# Patient Record
Sex: Female | Born: 1950 | Race: White | Hispanic: No | Marital: Married | Smoking: Never smoker
Health system: Southern US, Community
[De-identification: ages and names within clinical notes are randomized; demographics above are authoritative.]

## PROBLEM LIST (undated history)

## (undated) DIAGNOSIS — E782 Mixed hyperlipidemia: Secondary | ICD-10-CM

## (undated) HISTORY — DX: Mixed hyperlipidemia: E78.2

## (undated) HISTORY — PX: EYE SURGERY: SHX253

---

## 1990-07-12 HISTORY — PX: OTHER SURGICAL HISTORY: SHX169

## 2010-04-15 ENCOUNTER — Emergency Department: Payer: Self-pay | Admitting: Emergency Medicine

## 2011-03-23 ENCOUNTER — Emergency Department: Payer: Self-pay | Admitting: *Deleted

## 2011-05-08 IMAGING — CT CT ABD-PELV W/ CM
1 of 2 series · 15 of 32 positions shown, 19 images · IV contrast (isovue)
Comparison: None

REASON FOR EXAM: (1) abd pain - LLQ - suspect diverticulitis; (2) abd
pain - LLQ - suspect divert
COMMENTS:

PROCEDURE:     CT  - CT ABDOMEN / PELVIS  W  - April 15, 2010  [DATE]
RESULT:     History: Abdominal pain
TECHNIQUE: Multiple axial images of the abdomen and pelvis were performed
from the lung bases to the pubic symphysis, with p.o. contrast and with 85
mL of Isovue 370 intravenous contrast.

[Series 2: abdomen · axial · 0.56mm/px · z∈[-1106,-771]mm · 15 of 75 slices shown, 19 images]
[im 4/75  soft-tissue]
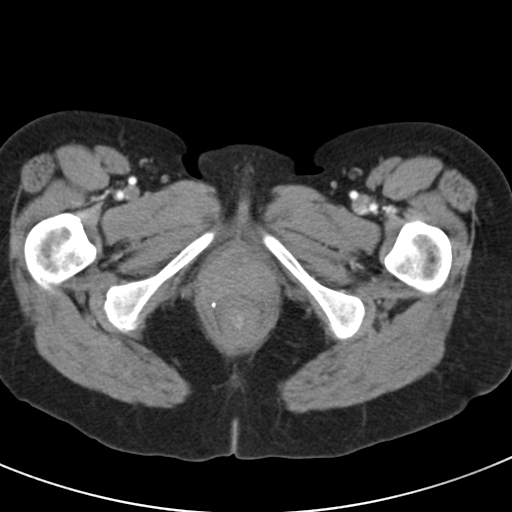
[im 4/75  bone]
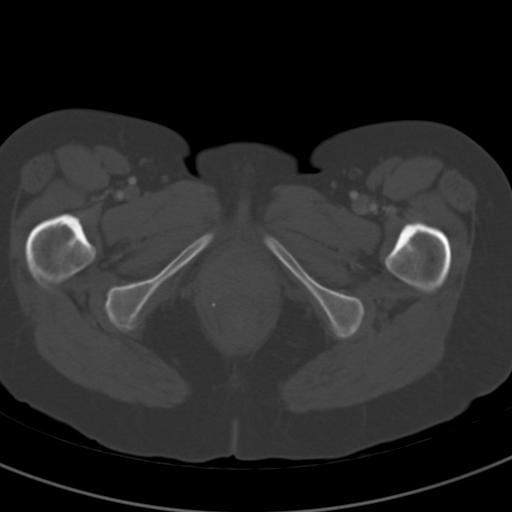
[im 10/75  soft-tissue]
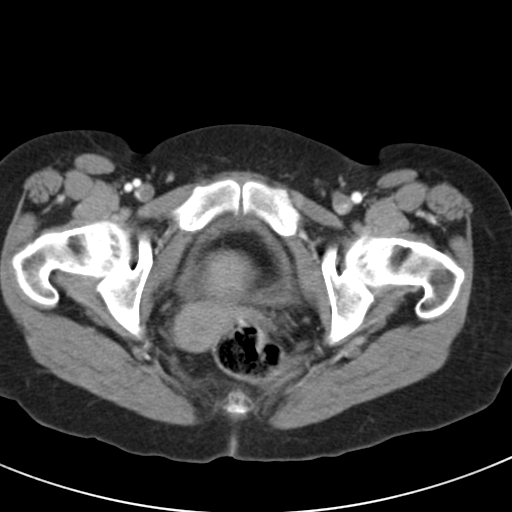
[im 16/75  soft-tissue]
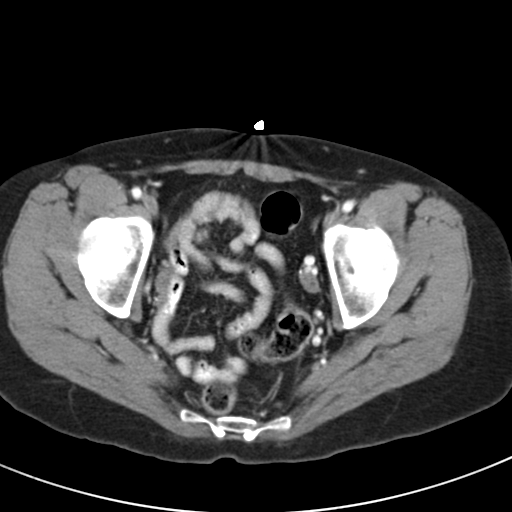
[im 22/75  soft-tissue]
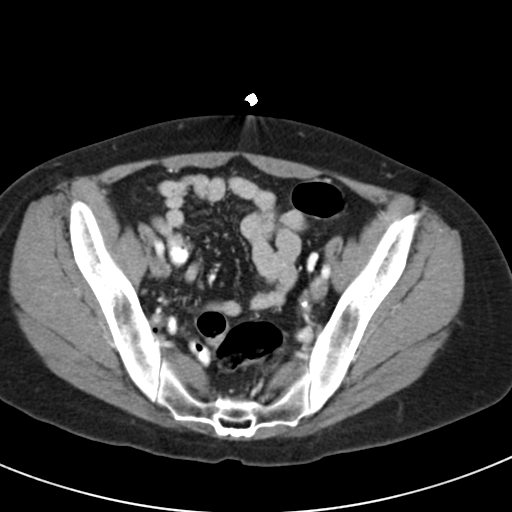
[im 25/75  soft-tissue]
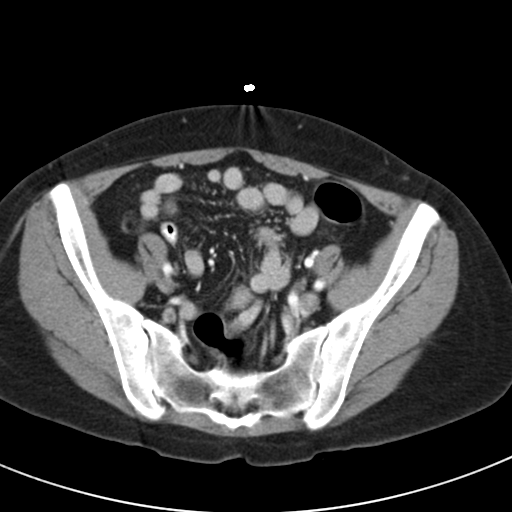
[im 31/75  soft-tissue]
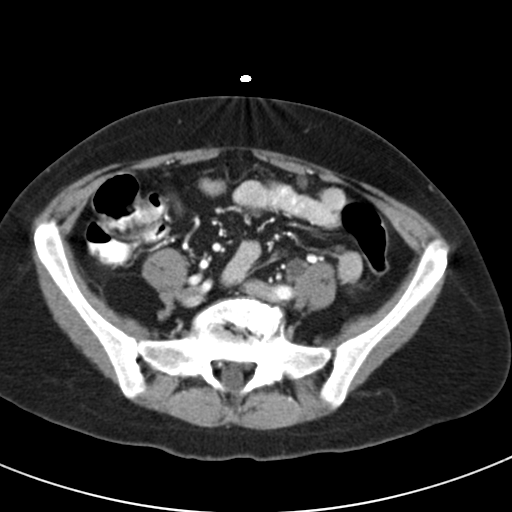
[im 38/75  soft-tissue]
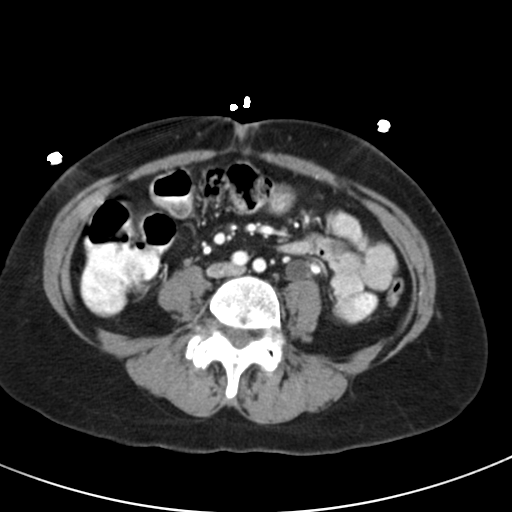
[im 44/75  soft-tissue]
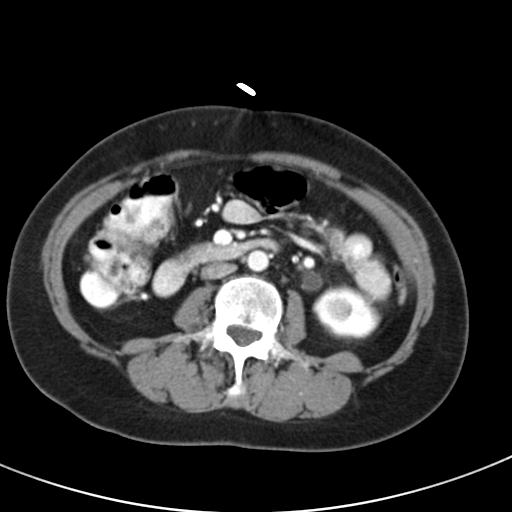
[im 50/75  soft-tissue]
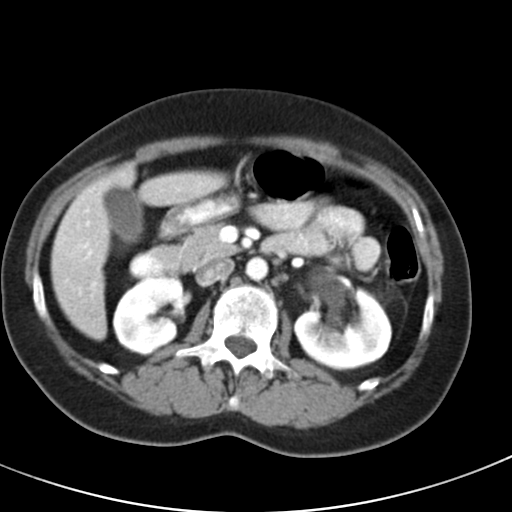
[im 50/75  bone]
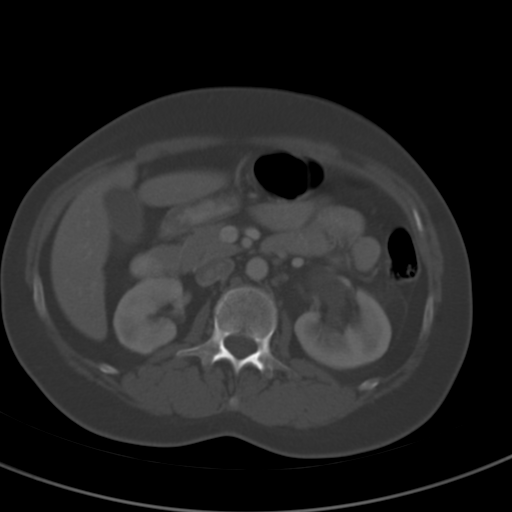
[im 53/75  soft-tissue]
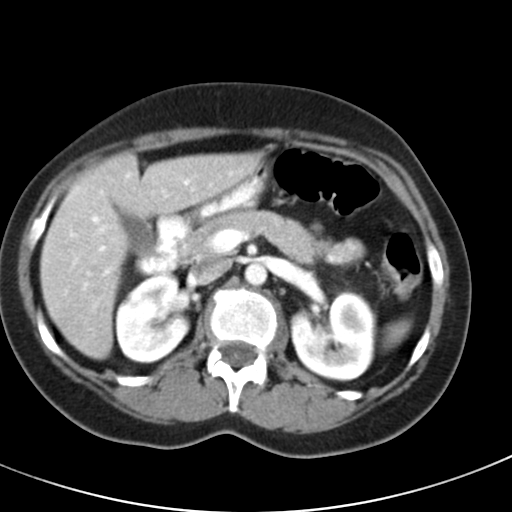
[im 59/75  soft-tissue]
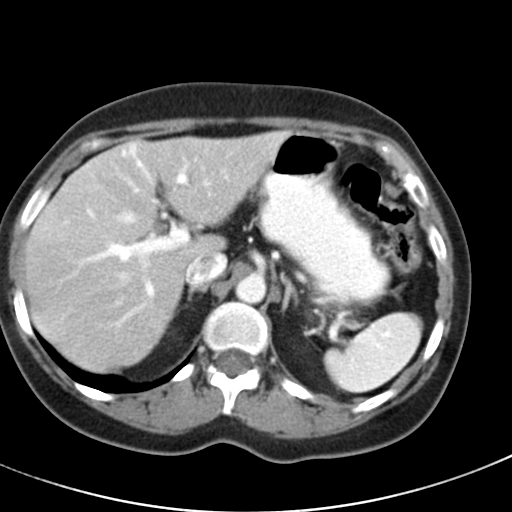
[im 62/75  lung]
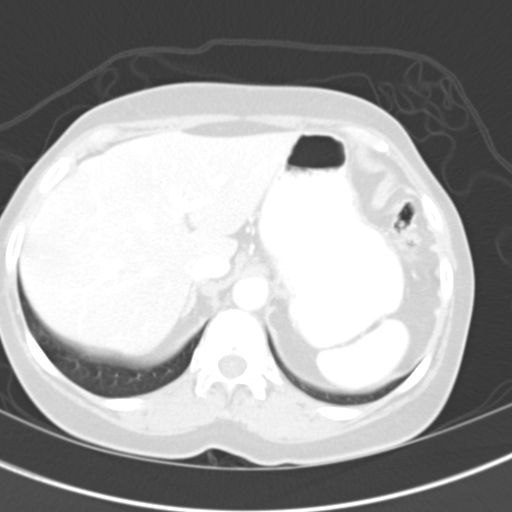
[im 65/75  soft-tissue]
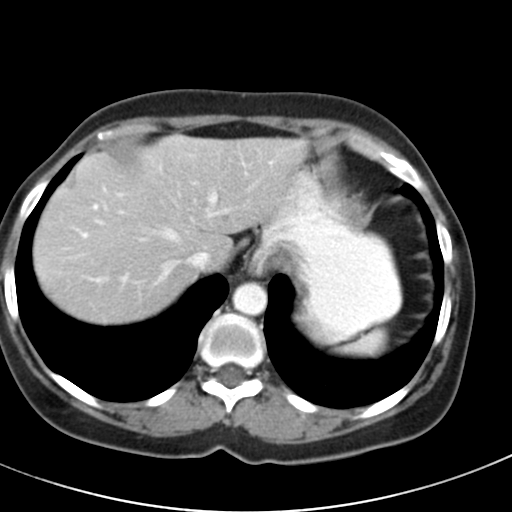
[im 65/75  lung]
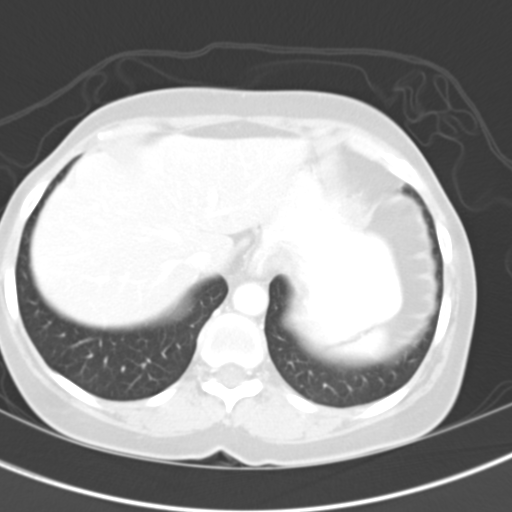
[im 68/75  lung]
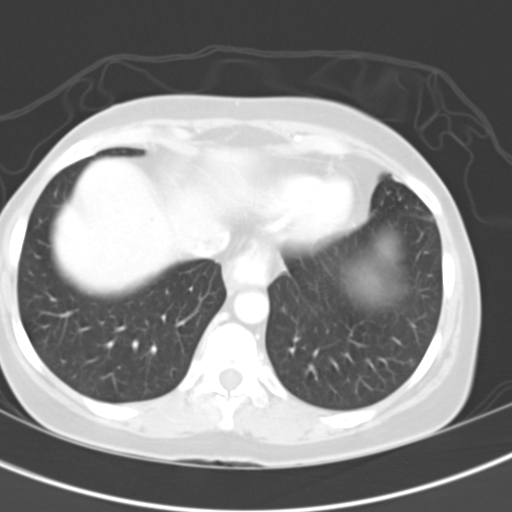
[im 71/75  soft-tissue]
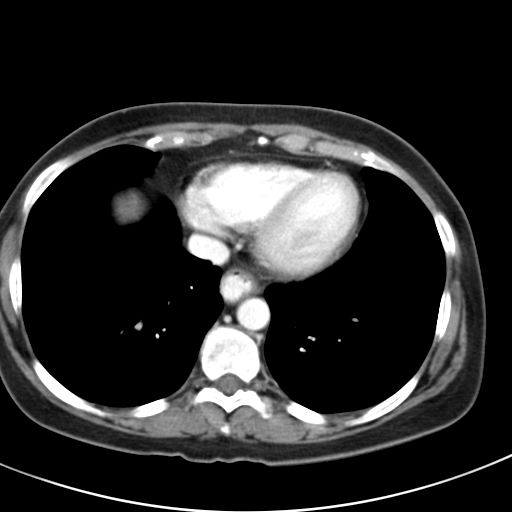
[im 71/75  lung]
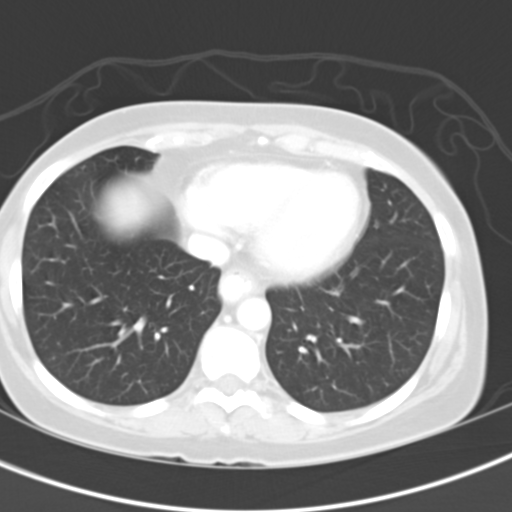

[15 of 32 positions shown; findings below may reference images not displayed]

FINDINGS: The lung bases are clear. There is no pneumothorax. The heart size is
normal.

The liver demonstrates no focal abnormality. There is no intrahepatic or
extrahepatic biliary ductal dilatation. The gallbladder is unremarkable. The
spleen demonstrates no focal abnormality. There is a 3 mm a distal left
ureteral calculus resulting in moderate left hydroureteronephrosis. There
multiple nonobstructing left nephrolithiasis. The right kidney, adrenal
glands, and pancreas are normal. The bladder is unremarkable.

The stomach, duodenum, small intestine, and large intestine demonstrate no
contrast extravasation or dilatation.  There is no pneumoperitoneum,
pneumatosis, or portal venous gas. There is no abdominal or pelvic free
fluid. There is no lymphadenopathy.

The abdominal aorta is normal in caliber .

There is lumbar spine spondylosis.
IMPRESSION: There is a 3 mm a distal left ureteral calculus resulting in moderate left
hydroureteronephrosis.

## 2012-11-28 ENCOUNTER — Ambulatory Visit: Payer: Self-pay | Admitting: Ophthalmology

## 2013-07-12 HISTORY — PX: EYE SURGERY: SHX253

## 2014-04-30 ENCOUNTER — Ambulatory Visit: Payer: Self-pay | Admitting: Ophthalmology

## 2014-11-01 NOTE — Op Note (Signed)
PATIENT NAME:  Cindy [name], [name] MR#:  [id number] DATE OF BIRTH:  [birth year]  DATE OF PROCEDURE:  11/28/2012  PREOPERATIVE DIAGNOSIS: Visually significant cataract of the right eye.   POSTOPERATIVE DIAGNOSIS: Visually significant cataract of the right eye.   OPERATIVE PROCEDURE: Cataract extraction by phacoemulsification with implant of intraocular lens to the right eye.   SURGEON: Galen ManilaWilliam Desten Manor, MD  ANESTHESIA:  1. Managed anesthesia care.  2. 50-50 mixture of 0.75% bupivacaine and 4% Xylocaine given as a retrobulbar block.   COMPLICATIONS: None.   TECHNIQUE: Stop and chop.  DESCRIPTION OF PROCEDURE: The patient was examined and consented for this procedure in the preoperative holding area and then brought back to the Operating Room where the anesthesia team employed managed anesthesia care.  3.5 milliliters of the aforementioned mixture were placed in the right orbit on an Atkinson needle without complication. The right eye was then prepped and draped in the usual sterile ophthalmic fashion. A lid speculum was placed. The side-port blade was used to create a paracentesis and the anterior chamber was filled with viscoelastic. The keratome was used to create a near clear corneal incision. The continuous curvilinear capsulorrhexis was performed with a cystotome followed by the capsulorrhexis forceps. Hydrodissection and hydrodelineation were carried out with BSS on a blunt cannula. The lens was removed in a stop and chop technique. The remaining cortical material was removed with the irrigation-aspiration handpiece. The capsular bag was inflated with viscoelastic and the Tecnis ZCB00 27.0-diopter lens, serial number [id number] was placed in the capsular bag without complication. The remaining viscoelastic was removed from the eye with the irrigation-aspiration handpiece. The wounds were hydrated. The anterior chamber was flushed with Miostat and the eye was inflated to a physiologic pressure. 0.1  mL of cefuroxime concentration 10 mg/mL was placed in the anterior chamber. The wounds were found to be water tight. The eye was dressed with Vigamox followed by Maxitrol ointment and a protective shield was placed. The patient will followup with me in one day.   Please note that VisionBlue dye was used to stain the anterior capsule prior to performing the capsulorrhexis, as this was a completely white cataract.   ____________________________ Jerilee FieldWilliam L. Mahayla Haddaway, MD wlp:jm D: 11/28/2012 13:17:39 ET T: 11/28/2012 14:05:16 ET JOB#: [id number]  cc: Kameren Baade L. Danae Oland, MD, <Dictator> Jerilee FieldWILLIAM L Tetsuo Coppola MD ELECTRONICALLY SIGNED 12/07/2012 17:55

## 2014-11-02 NOTE — Op Note (Signed)
PATIENT NAME:  Cindy [name], Cindy [name] MR#:  [id number] DATE OF BIRTH:  1951/05/07  DATE OF PROCEDURE:  04/30/2014  PREOPERATIVE DIAGNOSIS:  Visually significant cataract of the left eye.   POSTOPERATIVE DIAGNOSIS:  Visually significant cataract of the left eye.   OPERATIVE PROCEDURE:  Cataract extraction by phacoemulsification with implant of intraocular lens to left eye.   SURGEON:  Jerilee FieldWilliam L. Vernell Back, MD  ANESTHESIA:  1. Managed anesthesia care.  2. Topical tetracaine drops followed by 2% Xylocaine jelly applied in the preoperative holding area.   COMPLICATIONS:  None.   TECHNIQUE:  Stop and chop.   DESCRIPTION OF PROCEDURE:  The patient was examined and consented in the preoperative holding area where the aforementioned topical anesthesia was applied to the left eye and then brought back to the Operating Room where the left eye was prepped and draped in the usual sterile ophthalmic fashion and [name] lid speculum was placed. [name] paracentesis was created with the side port blade and the anterior chamber was filled with viscoelastic. [name] near clear corneal incision was performed with the steel keratome. [name] continuous curvilinear capsulorrhexis was performed with [name] cystotome followed by the capsulorrhexis forceps. Hydrodissection and hydrodelineation were carried out with BSS on [name] blunt cannula. The lens was removed in [name] stop and chop technique and the remaining cortical material was removed with the irrigation-aspiration handpiece. The capsular bag was inflated with viscoelastic and the Tecnis ZCB00, 26.0-diopter lens, serial number [id number], was placed in the capsular bag without complication. The remaining viscoelastic was removed from the eye with the irrigation-aspiration handpiece. The wounds were hydrated. The anterior chamber was flushed with Miostat and the eye was inflated to physiologic pressure. 0.1 mL of cefuroxime concentration 10 mg/mL was placed in the anterior chamber. The wounds were found to  be water tight. The eye was dressed with Vigamox. The patient was given protective glasses to wear throughout the day and [name] shield with which to sleep tonight. The patient was also given drops with which to begin [name] drop regimen today and will follow up with me in one day.    ____________________________ Jerilee FieldWilliam L. Anberlin Diez, MD wlp:nb D: 04/30/2014 21:27:53 ET T: 05/01/2014 00:30:28 ET JOB#: [id number]  cc: Shai Mckenzie L. Channah Godeaux, MD, <Dictator> Jerilee FieldWILLIAM L Kamariah Fruchter MD ELECTRONICALLY SIGNED 05/02/2014 8:55

## 2016-08-27 ENCOUNTER — Emergency Department
Admission: EM | Admit: 2016-08-27 | Discharge: 2016-08-28 | Disposition: A | Discharge status: Home / Self Care | Payer: BLUE CROSS/BLUE SHIELD | Attending: Emergency Medicine | Admitting: Emergency Medicine

## 2016-08-27 DIAGNOSIS — B349 Viral infection, unspecified: Secondary | ICD-10-CM | POA: Diagnosis not present

## 2016-08-27 DIAGNOSIS — E869 Volume depletion, unspecified: Secondary | ICD-10-CM | POA: Insufficient documentation

## 2016-08-27 DIAGNOSIS — R55 Syncope and collapse: Secondary | ICD-10-CM

## 2016-08-27 LAB — CBC WITH DIFFERENTIAL/PLATELET
BASOS PCT: 1 %
Basophils Absolute: 0 10*3/uL (ref 0–0.1)
Eosinophils Absolute: 0 10*3/uL (ref 0–0.7)
Eosinophils Relative: 0 %
HEMATOCRIT: 38.6 % (ref 35.0–47.0)
HEMOGLOBIN: 12.9 g/dL (ref 12.0–16.0)
LYMPHS PCT: 12 %
Lymphs Abs: 0.9 10*3/uL — ABNORMAL LOW (ref 1.0–3.6)
MCH: 31.6 pg (ref 26.0–34.0)
MCHC: 33.5 g/dL (ref 32.0–36.0)
MCV: 94.3 fL (ref 80.0–100.0)
MONOS PCT: 6 %
Monocytes Absolute: 0.5 10*3/uL (ref 0.2–0.9)
NEUTROS ABS: 6 10*3/uL (ref 1.4–6.5)
NEUTROS PCT: 81 %
Platelets: 207 10*3/uL (ref 150–440)
RBC: 4.09 MIL/uL (ref 3.80–5.20)
RDW: 15.1 % — ABNORMAL HIGH (ref 11.5–14.5)
WBC: 7.4 10*3/uL (ref 3.6–11.0)

## 2016-08-27 LAB — BASIC METABOLIC PANEL
ANION GAP: 9 (ref 5–15)
BUN: 22 mg/dL — ABNORMAL HIGH (ref 6–20)
CALCIUM: 8.6 mg/dL — AB (ref 8.9–10.3)
CHLORIDE: 101 mmol/L (ref 101–111)
CO2: 24 mmol/L (ref 22–32)
Creatinine, Ser: 0.85 mg/dL (ref 0.44–1.00)
GFR calc Af Amer: >60 mL/min (ref >60)
GFR calc non Af Amer: >60 mL/min (ref >60)
Glucose, Bld: 91 mg/dL (ref 65–99)
POTASSIUM: 3.8 mmol/L (ref 3.5–5.1)
Sodium: 134 mmol/L — ABNORMAL LOW (ref 135–145)

## 2016-08-27 LAB — HEPATIC FUNCTION PANEL
ALBUMIN: 4.3 g/dL (ref 3.5–5.0)
ALK PHOS: 60 U/L (ref 38–126)
ALT: 20 U/L (ref 14–54)
AST: 30 U/L (ref 15–41)
BILIRUBIN INDIRECT: 1 mg/dL — AB (ref 0.3–0.9)
Bilirubin, Direct: 0.1 mg/dL (ref 0.1–0.5)
Total Bilirubin: 1.1 mg/dL (ref 0.3–1.2)
Total Protein: 7.9 g/dL (ref 6.5–8.1)

## 2016-08-27 LAB — TROPONIN I: Troponin I: <0.03 ng/mL (ref <0.03)

## 2016-08-27 LAB — MAGNESIUM: Magnesium: 1.9 mg/dL (ref 1.7–2.4)

## 2016-08-27 MED ORDER — SODIUM CHLORIDE 0.9 % IV BOLUS (SEPSIS)
1000.0000 mL | Freq: Once | INTRAVENOUS | Status: AC
  Administered 2016-08-27: 1000 mL via INTRAVENOUS

## 2016-08-27 NOTE — ED Triage Notes (Signed)
Patient c/o nausea, weakness, dizziness/lightheadedness beginning Wednesday. Pt had one unwitnessed syncopal episode today.

## 2016-08-27 NOTE — ED Provider Notes (Signed)
[facility] Emergency Department Provider Note  ____________________________________________   First MD Initiated Contact with Patient 08/27/16 2259     (approximate)  I have reviewed the triage vital signs and the nursing notes.   HISTORY  Chief Complaint Loss of Consciousness and Nausea    HPI Cindy [name] is a 66 y.o. female with no personal reported medical history and who is generally healthy with no prior episodes of syncope or heart disease who presents for evaluation of general malaise over the last several days, nausea, diarrhea, and then a syncopal episode a couple of hours prior to arrival.  She states that she has been feeling generalized weakness over the last few days with lightheadedness and dizzy feeling which has been gradually worsening.  She has not had an appetite and has not been eating anything because she has been so nauseated.  She also reports multiple episodes of diarrhea each day for the last several days.  She has not had any abdominal pain and also denies shortness of breath and chest pain.  She states that the syncopal episode occurred relatively acutely but she did feel herself gradually getting hot all over, then had a cold sweat, became lightheaded, and then passed out.  Her son heard her hit the floor but she does not report any headache or neck pain at this time and did not sustain any other injuries.  She had no seizure-like activity and did not have any postictal symptoms.  She is alert and oriented at this time and dates that she feels fine currently.   History reviewed. No pertinent past medical history.  There are no active problems to display for this patient.   Past Surgical History:  Procedure Laterality Date  . EYE SURGERY     cataracts    Prior to Admission medications   Not on File    Allergies Patient has no known allergies.  Family History  Problem Relation Age of Onset  . Heart failure Mother      Social History Social History  Substance Use Topics  . Smoking status: Never Smoker  . Smokeless tobacco: Never Used  . Alcohol use No    Review of Systems Constitutional: No fever/chills. General malaise. Eyes: No visual changes. ENT: No sore throat. Cardiovascular: Denies chest pain.  +Syncope and collapse Respiratory: Denies shortness of breath. Gastrointestinal: No abdominal pain.  Nausea for several days, no vomiting.  Diarrhea several times a day for 2-3 days.  Decreased PO intake and appetite. Genitourinary: Negative for dysuria. Musculoskeletal: Negative for back pain. Skin: Negative for rash. Neurological: Negative for headaches, focal weakness or numbness.  10-point ROS otherwise negative.  ____________________________________________   PHYSICAL EXAM:  VITAL SIGNS: ED Triage Vitals  Enc Vitals Group     BP 08/27/16 2215 (!) 107/58     Pulse Rate 08/27/16 2215 (!) 52     Resp 08/27/16 2215 15     Temp 08/27/16 2215 97.6 F (36.4 C)     Temp Source 08/27/16 2215 Oral     SpO2 08/27/16 2215 100 %     Weight 08/27/16 2216 110 lb (49.9 kg)     Height 08/27/16 2216 5' (1.524 m)     Head Circumference --      Peak Flow --      Pain Score --      Pain Loc --      Pain Edu? --      Excl. in GC? --  Constitutional: Alert and oriented. Well appearing and in no acute distress. Eyes: Conjunctivae are normal. PERRL. EOMI. Head: Atraumatic. Nose: No congestion/rhinnorhea. Mouth/Throat: Mucous membranes are moist. Neck: No stridor.  No meningeal signs.   Cardiovascular: Normal rate, regular rhythm. Good peripheral circulation. Grossly normal heart sounds. Respiratory: Normal respiratory effort.  No retractions. Lungs CTAB. Gastrointestinal: Soft and nontender. No distention.  Musculoskeletal: No lower extremity tenderness nor edema. No gross deformities of extremities. Neurologic:  Normal speech and language. No gross focal neurologic deficits are  appreciated.  Skin:  Skin is warm, dry and intact. No rash noted. Psychiatric: Mood and affect are normal. Speech and behavior are normal.  ____________________________________________   LABS (all labs ordered are listed, but only abnormal results are displayed)  Labs Reviewed  BASIC METABOLIC PANEL - Abnormal; Notable for the following:       Result Value   Sodium 134 (*)    BUN 22 (*)    Calcium 8.6 (*)    All other components within normal limits  URINALYSIS, COMPLETE (UACMP) WITH MICROSCOPIC - Abnormal; Notable for the following:    Color, Urine YELLOW (*)    APPearance CLEAR (*)    Ketones, ur 20 (*)    Squamous Epithelial / LPF 0-5 (*)    All other components within normal limits  CBC WITH DIFFERENTIAL/PLATELET - Abnormal; Notable for the following:    RDW 15.1 (*)    Lymphs Abs 0.9 (*)    All other components within normal limits  HEPATIC FUNCTION PANEL - Abnormal; Notable for the following:    Indirect Bilirubin 1.0 (*)    All other components within normal limits  TROPONIN I  MAGNESIUM  CBG MONITORING, ED   ____________________________________________  EKG  ED ECG REPORT I, Zaviyar Rahal, the attending physician, personally viewed and interpreted this ECG.  Date: 08/27/2016 EKG Time: 22:30 Rate: 50 Rhythm: sinus bradycardia QRS Axis: normal Intervals: normal ST/T Wave abnormalities: normal Conduction Disturbances: none Narrative Interpretation: unremarkable  ____________________________________________  RADIOLOGY   No results found.  ____________________________________________   PROCEDURES  Procedure(s) performed:   Procedures   Critical Care performed: No ____________________________________________   INITIAL IMPRESSION / ASSESSMENT AND PLAN / ED COURSE  Pertinent labs & imaging results that were available during my care of the patient were reviewed by me and considered in my medical decision making (see chart for details).  Low  risk syncope based on the [place] syncope rule.  This is also in the setting of about 2-3 days of GI symptoms including nausea, decreased by mouth intake, and numerous episodes of diarrhea according to the patient.  Her CBC is normal with no anemia and no leukocytosis.  Metabolic panel is pending and I added on troponin.  Her EKG is reassuring.  She has had no other symptoms except those as described above and specifically no chest pain, no shortness of breath.  She is neurologically intact.  No indication for head CT or MRI at this point.  She is getting 1 L of fluids and then I will reassess after fluid bolus and checking electrolytes.  Patient and son agree with plan.   Clinical Course as of Aug 29 127  Sat Aug 28, 2016  0033 The patient has completed her 1 L fluid bolus and states that she feels better.  She has been sleeping comfortably.  Her labs were all unremarkable with no sign of acute infection and no electrolyte abnormalities.  I discussed the plan to check orthostatic vitals  and then discharge her for outpatient follow-up if she feels okay.  She and her son are in agreement with the plan.  [CF]  0125 Orthostatics reassuring.  Patient feels better and will follow up as outpatient.  [CF]    Clinical Course User Index [CF] Loleta Rose, MD    ____________________________________________  FINAL CLINICAL IMPRESSION(S) / ED DIAGNOSES  Final diagnoses:  Syncope and collapse  Viral syndrome  Volume depletion     MEDICATIONS GIVEN DURING THIS VISIT:  Medications  sodium chloride 0.9 % bolus 1,000 mL (1,000 mLs Intravenous New Bag/Given 08/27/16 2258)     NEW OUTPATIENT MEDICATIONS STARTED DURING THIS VISIT:  New Prescriptions   No medications on file    Modified Medications   No medications on file    Discontinued Medications   No medications on file     Note:  This document was prepared using Dragon voice recognition software and may include unintentional  dictation errors.    Loleta Rose, MD 08/28/16 [phone]

## 2016-08-28 LAB — URINALYSIS, COMPLETE (UACMP) WITH MICROSCOPIC
BACTERIA UA: NONE SEEN
BILIRUBIN URINE: NEGATIVE
GLUCOSE, UA: NEGATIVE mg/dL
Hgb urine dipstick: NEGATIVE
KETONES UR: 20 mg/dL — AB
Leukocytes, UA: NEGATIVE
NITRITE: NEGATIVE
PROTEIN: NEGATIVE mg/dL
Specific Gravity, Urine: 1.016 (ref 1.005–1.030)
pH: 6 (ref 5.0–8.0)

## 2016-08-28 MED ORDER — ONDANSETRON 4 MG PO TBDP: ORAL_TABLET | ORAL | 0 refills | Status: DC

## 2016-08-28 NOTE — Discharge Instructions (Signed)

## 2020-10-31 ENCOUNTER — Encounter: Payer: Self-pay | Admitting: Podiatry

## 2020-10-31 ENCOUNTER — Other Ambulatory Visit: Payer: Self-pay

## 2020-10-31 ENCOUNTER — Ambulatory Visit (INDEPENDENT_AMBULATORY_CARE_PROVIDER_SITE_OTHER): Payer: BC Managed Care – PPO

## 2020-10-31 ENCOUNTER — Ambulatory Visit: Payer: BC Managed Care – PPO | Admitting: Podiatry

## 2020-10-31 VITALS — BP 127/84 | HR 67 | Temp 98.2°F | Resp 16

## 2020-10-31 DIAGNOSIS — M778 Other enthesopathies, not elsewhere classified: Secondary | ICD-10-CM

## 2020-10-31 MED ORDER — MELOXICAM 15 MG PO TABS: 15.0000 mg | ORAL_TABLET | Freq: Every day | ORAL | 1 refills | Status: DC

## 2020-11-16 MED ORDER — BETAMETHASONE SOD PHOS & ACET 6 (3-3) MG/ML IJ SUSP: 3.0000 mg | Freq: Once | INTRAMUSCULAR | Status: DC

## 2020-11-16 NOTE — Progress Notes (Signed)
   HPI: 70 y.o. female presenting today as a new patient for evaluation of right foot pain this been going on for approximately 2 months.  Gradual onset.  Patient states is very tender.  Aggravated by walking and tight shoes.  She has been applying pain cream and taking Tylenol.  She denies a history of injury.  No past medical history on file.   Physical Exam: General: The patient is alert and oriented x3 in no acute distress.  Dermatology: Skin is warm, dry and supple bilateral lower extremities. Negative for open lesions or macerations.  Vascular: Palpable pedal pulses bilaterally. No edema or erythema noted. Capillary refill within normal limits.  Neurological: Epicritic and protective threshold grossly intact bilaterally.   Musculoskeletal Exam: Range of motion within normal limits to all pedal and ankle joints bilateral. Muscle strength 5/5 in all groups bilateral.  There is pain on palpation throughout the right midtarsal joint of the right foot Radiographic Exam:  Normal osseous mineralization. Joint spaces preserved. No fracture/dislocation/boney destruction.    Assessment: 1.  Capsulitis right midtarsal joint   Plan of Care:  1. Patient evaluated. X-Rays reviewed.  2.  Injection of 0.5 cc Celestone Soluspan injected into the right midtarsal joint/midfoot 3.  Prescription for meloxicam 15 mg daily 4.  Recommend good supportive shoes and sneakers.  Patient's current sneakers did not allow for any support 5.  Return to clinic in 4 weeks  *Works at [employer], DPM [facility]  Dr. Felecia Shelling, DPM    [address] [place], [place] [address]                Office [phone]  Fax [phone]

## 2020-12-05 ENCOUNTER — Other Ambulatory Visit: Payer: Self-pay

## 2020-12-05 ENCOUNTER — Encounter: Payer: Self-pay | Admitting: Podiatry

## 2020-12-05 ENCOUNTER — Ambulatory Visit: Payer: BC Managed Care – PPO | Admitting: Podiatry

## 2020-12-05 DIAGNOSIS — M778 Other enthesopathies, not elsewhere classified: Secondary | ICD-10-CM | POA: Diagnosis not present

## 2020-12-05 MED ORDER — MELOXICAM 15 MG PO TABS: 15.0000 mg | ORAL_TABLET | Freq: Every day | ORAL | 1 refills | Status: DC

## 2020-12-05 NOTE — Progress Notes (Signed)
   HPI: 70 y.o. female presenting today for follow-up evaluation of capsulitis to the right foot.  Patient states that she has no pain.  She stated that the injection helped significantly as well as the meloxicam.  She also purchased a pair of new balance shoes for good support which helped also alleviate her pain and symptoms.  No new complaints at this time  No past medical history on file.   Physical Exam: General: The patient is alert and oriented x3 in no acute distress.  Dermatology: Skin is warm, dry and supple bilateral lower extremities. Negative for open lesions or macerations.  Vascular: Palpable pedal pulses bilaterally. No edema or erythema noted. Capillary refill within normal limits.  Neurological: Epicritic and protective threshold grossly intact bilaterally.   Musculoskeletal Exam: Range of motion within normal limits to all pedal and ankle joints bilateral. Muscle strength 5/5 in all groups bilateral.  Negative for any significant pain on palpation throughout the right midtarsal joint of the right foot  Assessment: 1.  Capsulitis right midtarsal joint; resolved   Plan of Care:  1. Patient evaluated.  2.  Continue meloxicam as needed.  Refill provided 3.  Continue wearing good supportive new balance shoes.  Patient states that they help a lot 4.  Return to clinic as needed  *Works at [employer], DPM [facility]  Dr. Felecia Shelling, DPM    [address] [place], [place] [address]                Office [phone]  Fax [phone]

## 2021-03-30 ENCOUNTER — Other Ambulatory Visit: Payer: Self-pay | Admitting: Podiatry

## 2021-03-30 ENCOUNTER — Telehealth: Payer: Self-pay | Admitting: Podiatry

## 2021-03-30 MED ORDER — MELOXICAM 15 MG PO TABS: 15.0000 mg | ORAL_TABLET | Freq: Every day | ORAL | 1 refills | Status: DC

## 2021-03-30 NOTE — Telephone Encounter (Signed)
Refill rx meloxicam sent to [facility] in [place]. No need for appt. - Dr. Logan Bores

## 2021-03-30 NOTE — Telephone Encounter (Signed)
Patient called stating she saw Dr Logan Bores months ago for an issue with her foot and he put her on medication. Patient states she is having the same issue. Patient wanted to know if she could get a RX called in for the medicine or does she need to come in? [facility] [place].

## 2021-03-31 NOTE — Telephone Encounter (Signed)
Called pt LM on VM RX was called in to pharmacy.

## 2022-11-11 ENCOUNTER — Ambulatory Visit: Payer: BC Managed Care – PPO | Admitting: Internal Medicine

## 2022-11-11 VITALS — BP 124/78 | HR 85 | Ht <= 58 in | Wt 98.8 lb

## 2022-11-11 DIAGNOSIS — Z1211 Encounter for screening for malignant neoplasm of colon: Secondary | ICD-10-CM

## 2022-11-11 DIAGNOSIS — Z8249 Family history of ischemic heart disease and other diseases of the circulatory system: Secondary | ICD-10-CM

## 2022-11-11 DIAGNOSIS — Z1389 Encounter for screening for other disorder: Secondary | ICD-10-CM

## 2022-11-11 DIAGNOSIS — Z1231 Encounter for screening mammogram for malignant neoplasm of breast: Secondary | ICD-10-CM | POA: Diagnosis not present

## 2022-11-11 DIAGNOSIS — Z0000 Encounter for general adult medical examination without abnormal findings: Secondary | ICD-10-CM

## 2022-11-11 DIAGNOSIS — R8281 Pyuria: Secondary | ICD-10-CM

## 2022-11-11 DIAGNOSIS — Z13820 Encounter for screening for osteoporosis: Secondary | ICD-10-CM

## 2022-11-11 LAB — POCT URINALYSIS DIPSTICK
Bilirubin, UA: NEGATIVE
Blood, UA: NEGATIVE
Glucose, UA: NEGATIVE
Ketones, UA: NEGATIVE
Nitrite, UA: NEGATIVE
Protein, UA: POSITIVE — AB
Spec Grav, UA: 1.025 (ref 1.010–1.025)
Urobilinogen, UA: 1 E.U./dL
pH, UA: 6.5 (ref 5.0–8.0)

## 2022-11-12 ENCOUNTER — Encounter: Payer: Self-pay | Admitting: Internal Medicine

## 2022-11-12 DIAGNOSIS — Z8249 Family history of ischemic heart disease and other diseases of the circulatory system: Secondary | ICD-10-CM | POA: Insufficient documentation

## 2022-11-12 DIAGNOSIS — R8281 Pyuria: Secondary | ICD-10-CM | POA: Insufficient documentation

## 2022-11-12 DIAGNOSIS — Z0000 Encounter for general adult medical examination without abnormal findings: Secondary | ICD-10-CM | POA: Insufficient documentation

## 2022-11-12 LAB — CBC WITH DIFFERENTIAL
Basophils Absolute: 0.1 10*3/uL (ref 0.0–0.2)
Basos: 2 %
EOS (ABSOLUTE): 0.3 10*3/uL (ref 0.0–0.4)
Eos: 4 %
Hematocrit: 40.2 % (ref 34.0–46.6)
Hemoglobin: 12.8 g/dL (ref 11.1–15.9)
Immature Grans (Abs): 0 10*3/uL (ref 0.0–0.1)
Immature Granulocytes: 0 %
Lymphocytes Absolute: 3.4 10*3/uL — ABNORMAL HIGH (ref 0.7–3.1)
Lymphs: 38 %
MCH: 30.6 pg (ref 26.6–33.0)
MCHC: 31.8 g/dL (ref 31.5–35.7)
MCV: 96 fL (ref 79–97)
Monocytes Absolute: 0.5 10*3/uL (ref 0.1–0.9)
Monocytes: 5 %
Neutrophils Absolute: 4.6 10*3/uL (ref 1.4–7.0)
Neutrophils: 51 %
RBC: 4.18 x10E6/uL (ref 3.77–5.28)
RDW: 13.1 % (ref 11.7–15.4)
WBC: 8.9 10*3/uL (ref 3.4–10.8)

## 2022-11-12 LAB — CMP14+EGFR
ALT: 10 IU/L (ref 0–32)
AST: 24 IU/L (ref 0–40)
Albumin/Globulin Ratio: 1.6 (ref 1.2–2.2)
Albumin: 4.4 g/dL (ref 3.9–4.9)
Alkaline Phosphatase: 85 IU/L (ref 44–121)
BUN/Creatinine Ratio: 20 (ref 12–28)
BUN: 18 mg/dL (ref 8–27)
Bilirubin Total: 0.5 mg/dL (ref 0.0–1.2)
CO2: 22 mmol/L (ref 20–29)
Calcium: 9.2 mg/dL (ref 8.7–10.3)
Chloride: 102 mmol/L (ref 96–106)
Creatinine, Ser: 0.89 mg/dL (ref 0.57–1.00)
Globulin, Total: 2.8 g/dL (ref 1.5–4.5)
Glucose: 80 mg/dL (ref 70–99)
Potassium: 4.2 mmol/L (ref 3.5–5.2)
Sodium: 139 mmol/L (ref 134–144)
Total Protein: 7.2 g/dL (ref 6.0–8.5)
eGFR: 70 mL/min/{1.73_m2} (ref >59)

## 2022-11-12 LAB — URINALYSIS
Bilirubin, UA: NEGATIVE
Glucose, UA: NEGATIVE
Ketones, UA: NEGATIVE
Nitrite, UA: NEGATIVE
RBC, UA: NEGATIVE
Specific Gravity, UA: 1.022 (ref 1.005–1.030)
Urobilinogen, Ur: 1 mg/dL (ref 0.2–1.0)
pH, UA: 6.5 (ref 5.0–7.5)

## 2022-11-12 LAB — LIPID PANEL W/O CHOL/HDL RATIO
Cholesterol, Total: 301 mg/dL — ABNORMAL HIGH (ref 100–199)
HDL: 75 mg/dL (ref >39)
LDL Chol Calc (NIH): 212 mg/dL — ABNORMAL HIGH (ref 0–99)
Triglycerides: 85 mg/dL (ref 0–149)
VLDL Cholesterol Cal: 14 mg/dL (ref 5–40)

## 2022-11-12 LAB — TSH: TSH: 4.34 u[IU]/mL (ref 0.450–4.500)

## 2022-11-12 NOTE — Progress Notes (Signed)
New Patient Office Visit  Subjective    Patient ID: Cindy [name], female    DOB: 1951/02/16  Age: 72 y.o. MRN: [id number]  CC:  Chief Complaint  Patient presents with   Establish Care    New Patient    HPI Cindy [name] presents to establish care Previous Primary Care provider/office:   she does not have additional concerns to discuss today.   Patient comes in to establish PMD.  Reports that she has not been to her doctor other than a podiatrist for her foot pain.  She has not had any preventative management.  Patient offers no complaints whatsoever.  She says she is still working and and works the night shift.  She sleeps during the day and thinks she gets adequate sleep.  She does not have any fatigue or weakness. Patient denies any chest pain or shortness of breath, no nausea vomiting, no diarrhea or constipation, and no aches or pains.  Patient has not had any mammogram, bone density ,or colon cancer screening.  Her last Pap was in 1992. Her PHQ-9/GAD score is 2/0.    Outpatient Encounter Medications as of 11/11/2022  Medication Sig   Acetaminophen (TYLENOL 8 HOUR PO) Take 200 mg by mouth as needed.   meloxicam (MOBIC) 15 MG tablet Take 1 tablet (15 mg total) by mouth daily. (Patient not taking: Reported on 11/11/2022)   ondansetron (ZOFRAN ODT) 4 MG disintegrating tablet Allow 1-2 tablets to dissolve in your mouth every 8 hours as needed for nausea/vomiting (Patient not taking: Reported on 10/31/2020)   Facility-Administered Encounter Medications as of 11/11/2022  Medication   betamethasone acetate-betamethasone sodium phosphate (CELESTONE) injection 3 mg    History reviewed. No pertinent past medical history.  Past Surgical History:  Procedure Laterality Date   EYE SURGERY     cataracts    Family History  Problem Relation Age of Onset   Heart failure Mother     Social History   Socioeconomic History   Marital status: Married    Spouse name: Not on file    Number of children: Not on file   Years of education: Not on file   Highest education level: Not on file  Occupational History   Not on file  Tobacco Use   Smoking status: Never   Smokeless tobacco: Never  Substance and Sexual Activity   Alcohol use: No   Drug use: No   Sexual activity: Not on file  Other Topics Concern   Not on file  Social History Narrative   Not on file   Social Determinants of Health   Financial Resource Strain: Not on file  Food Insecurity: Not on file  Transportation Needs: Not on file  Physical Activity: Not on file  Stress: Not on file  Social Connections: Not on file  Intimate Partner Violence: Not on file    Review of Systems  Constitutional:  Negative for chills, diaphoresis, fever, malaise/fatigue and weight loss.  HENT:  Negative for ear discharge, ear pain, hearing loss, sinus pain, sore throat and tinnitus.   Eyes:  Negative for blurred vision, double vision and photophobia.  Respiratory:  Negative for cough, shortness of breath and wheezing.   Cardiovascular:  Negative for chest pain, palpitations and claudication.  Gastrointestinal:  Negative for abdominal pain, blood in stool, constipation, diarrhea, heartburn, melena, nausea and vomiting.  Genitourinary:  Negative for dysuria, flank pain, frequency, hematuria and urgency.  Musculoskeletal:  Negative for back pain, falls, joint pain and  myalgias.  Skin: Negative.   Neurological:  Negative for dizziness, tingling, tremors, focal weakness, seizures, loss of consciousness, weakness and headaches.  Psychiatric/Behavioral:  Negative for depression, memory loss, substance abuse and suicidal ideas. The patient does not have insomnia.         Objective    BP 124/78   Pulse 85   Ht 4\' 10"  (1.473 m)   Wt 98 lb 12.8 oz (44.8 kg)   SpO2 99%   BMI 20.65 kg/m   Physical Exam Vitals and nursing note reviewed.  Constitutional:      Appearance: Normal appearance.  Cardiovascular:     Rate  and Rhythm: Normal rate and regular rhythm.     Pulses: Normal pulses.     Heart sounds: Normal heart sounds. No murmur heard. Pulmonary:     Breath sounds: No wheezing, rhonchi or rales.  Abdominal:     General: Abdomen is flat. Bowel sounds are normal. There is no distension.     Palpations: Abdomen is soft. There is no mass.     Tenderness: There is no abdominal tenderness. There is no guarding.     Hernia: No hernia is present.  Musculoskeletal:        General: No swelling or tenderness. Normal range of motion.     Cervical back: Normal range of motion and neck supple.     Left lower leg: No edema.  Skin:    General: Skin is warm and dry.  Neurological:     General: No focal deficit present.     Mental Status: She is alert and oriented to person, place, and time.     Sensory: No sensory deficit.     Deep Tendon Reflexes: Reflexes normal.  Psychiatric:        Mood and Affect: Mood normal.        Behavior: Behavior normal.        Assessment & Plan:  Patient will get fasting blood work today.  Schedule mammogram, bone density, Cologuard.  Patient will return for a Pap smear. Problem List Items Addressed This Visit     Encounter for initial annual wellness visit (AWV) in Medicare patient - Primary   Relevant Orders   EKG 12-Lead   Pyuria   Relevant Orders   Urinalysis (Completed)   Urine Culture   Family history of early CAD   Other Visit Diagnoses     Screening for osteoporosis       Relevant Orders   DG Bone Density   Encounter for screening mammogram for malignant neoplasm of breast       Relevant Orders   MM 3D SCREENING MAMMOGRAM BILATERAL BREAST   Screening for colon cancer       Relevant Orders   Cologuard   Adult general medical examination       Relevant Orders   DG Chest 2 View   CBC With Differential (Completed)   CMP14+EGFR (Completed)   Lipid Panel w/o Chol/HDL Ratio (Completed)   TSH (Completed)   Screening for blood or protein in urine        Relevant Orders   POCT Urinalysis Dipstick (81002) (Completed)       Return in about 3 weeks (around 12/02/2022).   Total time spent: 45 minutes  Margaretann Loveless, MD  11/11/2022

## 2022-11-13 LAB — URINE CULTURE

## 2022-11-16 ENCOUNTER — Other Ambulatory Visit: Payer: Self-pay

## 2022-11-16 MED ORDER — ROSUVASTATIN CALCIUM 10 MG PO TABS: 10.0000 mg | ORAL_TABLET | Freq: Every day | ORAL | 6 refills | Status: DC

## 2022-11-18 ENCOUNTER — Ambulatory Visit (INDEPENDENT_AMBULATORY_CARE_PROVIDER_SITE_OTHER): Payer: BC Managed Care – PPO

## 2022-11-18 DIAGNOSIS — Z0000 Encounter for general adult medical examination without abnormal findings: Secondary | ICD-10-CM

## 2022-11-30 ENCOUNTER — Other Ambulatory Visit: Payer: BC Managed Care – PPO

## 2022-12-02 ENCOUNTER — Ambulatory Visit: Payer: BC Managed Care – PPO | Admitting: Internal Medicine

## 2022-12-07 ENCOUNTER — Ambulatory Visit (INDEPENDENT_AMBULATORY_CARE_PROVIDER_SITE_OTHER): Payer: BC Managed Care – PPO

## 2022-12-07 ENCOUNTER — Encounter: Payer: Self-pay | Admitting: Internal Medicine

## 2022-12-07 ENCOUNTER — Ambulatory Visit: Payer: BC Managed Care – PPO | Admitting: Internal Medicine

## 2022-12-07 VITALS — BP 120/72 | HR 87 | Ht <= 58 in | Wt 101.0 lb

## 2022-12-07 DIAGNOSIS — M818 Other osteoporosis without current pathological fracture: Secondary | ICD-10-CM | POA: Diagnosis not present

## 2022-12-07 DIAGNOSIS — E782 Mixed hyperlipidemia: Secondary | ICD-10-CM | POA: Diagnosis not present

## 2022-12-07 DIAGNOSIS — Z1211 Encounter for screening for malignant neoplasm of colon: Secondary | ICD-10-CM

## 2022-12-07 DIAGNOSIS — Z13820 Encounter for screening for osteoporosis: Secondary | ICD-10-CM | POA: Diagnosis not present

## 2022-12-07 MED ORDER — ALENDRONATE SODIUM 70 MG PO TABS: 70.0000 mg | ORAL_TABLET | ORAL | 3 refills | Status: DC

## 2022-12-07 NOTE — Progress Notes (Signed)
Established Patient Office Visit  Subjective:  Patient ID: Cindy [name], female    DOB: 1950/10/30  Age: 72 y.o. MRN: [id number]  Chief Complaint  Patient presents with   Follow-up    3 week follow up    Patient comes in for a follow-up today.  She had labs done last time which showed a very high LDL cholesterol.  She was started on Crestor and she is taking it regularly now.  Patient today wants to get a colonoscopy done instead of a Cologuard because it seems too complicated.  Will send in a GI referral to set up for screening colonoscopy.  Her mammogram and bone density will also be scheduled. Patient has no other complaints. Will get a Pap smear at next visit. BMD shows Osteoporosis, will start Fosamax and Caltrate D.    No other concerns at this time.   Past Medical History:  Diagnosis Date   Mixed hyperlipidemia     Past Surgical History:  Procedure Laterality Date   BTL  1992   EYE SURGERY  2015   cataracts    Social History   Socioeconomic History   Marital status: Married    Spouse name: Not on file   Number of children: Not on file   Years of education: Not on file   Highest education level: Not on file  Occupational History   Not on file  Tobacco Use   Smoking status: Never   Smokeless tobacco: Never  Substance and Sexual Activity   Alcohol use: No   Drug use: No   Sexual activity: Not on file  Other Topics Concern   Not on file  Social History Narrative   Not on file   Social Determinants of Health   Financial Resource Strain: Not on file  Food Insecurity: Not on file  Transportation Needs: Not on file  Physical Activity: Not on file  Stress: Not on file  Social Connections: Not on file  Intimate Partner Violence: Not on file    Family History  Problem Relation Age of Onset   Heart failure Mother     No Known Allergies  Review of Systems  Constitutional:  Negative for chills, diaphoresis, fever, malaise/fatigue and weight loss.   HENT:  Negative for hearing loss, nosebleeds, sore throat and tinnitus.   Eyes: Negative.   Respiratory:  Negative for cough, shortness of breath, wheezing and stridor.   Cardiovascular:  Negative for chest pain, palpitations, leg swelling and PND.  Gastrointestinal:  Negative for abdominal pain, blood in stool, heartburn, melena, nausea and vomiting.  Genitourinary: Negative.   Musculoskeletal:  Negative for falls, joint pain, myalgias and neck pain.  Skin: Negative.   Neurological:  Negative for dizziness, tingling, tremors, seizures, loss of consciousness and headaches.  Psychiatric/Behavioral:  Negative for depression. The patient is not nervous/anxious and does not have insomnia.        Objective:   BP 120/72   Pulse 87   Ht 4\' 10"  (1.473 m)   Wt 101 lb (45.8 kg)   SpO2 99%   BMI 21.11 kg/m   Vitals:   12/07/22 1343  BP: 120/72  Pulse: 87  Height: 4\' 10"  (1.473 m)  Weight: 101 lb (45.8 kg)  SpO2: 99%  BMI (Calculated): 21.11    Physical Exam Vitals and nursing note reviewed.  Constitutional:      Appearance: Normal appearance.  HENT:     Head: Normocephalic.     Nose: Nose normal.  Mouth/Throat:     Mouth: Mucous membranes are moist.  Cardiovascular:     Rate and Rhythm: Normal rate and regular rhythm.     Pulses: Normal pulses.     Heart sounds: Normal heart sounds. No murmur heard. Pulmonary:     Effort: Pulmonary effort is normal.     Breath sounds: Normal breath sounds. No wheezing, rhonchi or rales.  Abdominal:     General: Bowel sounds are normal. There is no distension.     Palpations: Abdomen is soft. There is no mass.     Tenderness: There is no abdominal tenderness. There is no right CVA tenderness or left CVA tenderness.  Musculoskeletal:        General: No swelling, tenderness, deformity or signs of injury. Normal range of motion.     Cervical back: Normal range of motion and neck supple.     Right lower leg: No edema.     Left lower  leg: No edema.  Skin:    General: Skin is warm.     Coloration: Skin is not jaundiced.     Findings: No lesion.  Neurological:     General: No focal deficit present.     Mental Status: She is alert and oriented to person, place, and time.  Psychiatric:        Mood and Affect: Mood normal.        Behavior: Behavior normal.     No results found for any visits on 12/07/22.      Assessment & Plan:  Patient advised to continue taking her statin and strict diet control. Needs to schedule her mammogram.  Referral sent to GI for screening colonoscopy. Problem List Items Addressed This Visit     Mixed hyperlipidemia - Primary   Other osteoporosis without current pathological fracture   Relevant Medications   alendronate (FOSAMAX) 70 MG tablet   Other Visit Diagnoses     Colon cancer screening       Relevant Orders   Ambulatory referral to Gastroenterology       Follow up 3 months.   Total time spent: 30 minutes  Margaretann Loveless, MD  12/07/2022   This document may have been prepared by [facility] Voice Recognition software and as such may include unintentional dictation errors.

## 2022-12-16 ENCOUNTER — Ambulatory Visit
Admission: RE | Admit: 2022-12-16 | Discharge: 2022-12-16 | Disposition: A | Discharge status: Home / Self Care | Payer: BC Managed Care – PPO | Source: Ambulatory Visit | Attending: Internal Medicine | Admitting: Internal Medicine

## 2022-12-16 DIAGNOSIS — Z1231 Encounter for screening mammogram for malignant neoplasm of breast: Secondary | ICD-10-CM | POA: Insufficient documentation

## 2022-12-17 ENCOUNTER — Telehealth: Payer: Self-pay

## 2022-12-17 NOTE — Telephone Encounter (Signed)
Pt called and left vm regarding rx Fosamax not covered by insurance she asked if there's an alternative rx she can take for her bones? Please advise

## 2022-12-20 ENCOUNTER — Other Ambulatory Visit: Payer: Self-pay

## 2022-12-20 DIAGNOSIS — M818 Other osteoporosis without current pathological fracture: Secondary | ICD-10-CM

## 2022-12-20 MED ORDER — IBANDRONATE SODIUM 150 MG PO TABS: 150.0000 mg | ORAL_TABLET | ORAL | 3 refills | Status: DC

## 2022-12-23 ENCOUNTER — Telehealth: Payer: Self-pay | Admitting: Internal Medicine

## 2022-12-23 NOTE — Telephone Encounter (Signed)
Patient left VM that her prescription for her bone medicine is too expensive and wants to know if theres an alternative that is cheaper that we can send in.

## 2022-12-24 NOTE — Telephone Encounter (Signed)
Patient left VM that she can get the Prolia for a discounted price. Need to call and get more info on this.

## 2023-01-18 ENCOUNTER — Other Ambulatory Visit: Payer: Self-pay

## 2023-01-18 MED ORDER — DENOSUMAB 60 MG/ML ~~LOC~~ SOSY: 60.0000 mg | PREFILLED_SYRINGE | SUBCUTANEOUS | 3 refills | Status: DC

## 2023-03-10 ENCOUNTER — Ambulatory Visit: Payer: BC Managed Care – PPO | Admitting: Internal Medicine

## 2023-03-10 ENCOUNTER — Encounter: Payer: Self-pay | Admitting: Internal Medicine

## 2023-03-10 VITALS — BP 134/90 | HR 78 | Ht 59.0 in | Wt 98.2 lb

## 2023-03-10 DIAGNOSIS — E782 Mixed hyperlipidemia: Secondary | ICD-10-CM | POA: Diagnosis not present

## 2023-03-10 DIAGNOSIS — R030 Elevated blood-pressure reading, without diagnosis of hypertension: Secondary | ICD-10-CM | POA: Diagnosis not present

## 2023-03-10 DIAGNOSIS — M818 Other osteoporosis without current pathological fracture: Secondary | ICD-10-CM | POA: Diagnosis not present

## 2023-03-10 NOTE — Progress Notes (Signed)
Established Patient Office Visit  Subjective:  Patient ID: Cindy [name], female    DOB: 1950-10-09  Age: 72 y.o. MRN: [id number]  Chief Complaint  Patient presents with   Follow-up    3 month follow up    Patient comes in for follow-up today.  She has been taking her Crestor regularly and is fasting for blood work.  However her blood pressure is high.  She denies any headache or dizziness, no chest pain and no palpitations. She is taking Fosamax for osteoporosis, reminded to take calcium plus vitamin D with it. She is also scheduled to have colonoscopy for colon cancer screening. Patient will get a Pap at next visit.  Needs to monitor her blood pressure.    No other concerns at this time.   Past Medical History:  Diagnosis Date   Mixed hyperlipidemia     Past Surgical History:  Procedure Laterality Date   BTL  1992   EYE SURGERY  2015   cataracts    Social History   Socioeconomic History   Marital status: Married    Spouse name: Not on file   Number of children: Not on file   Years of education: Not on file   Highest education level: Not on file  Occupational History   Not on file  Tobacco Use   Smoking status: Never   Smokeless tobacco: Never  Substance and Sexual Activity   Alcohol use: No   Drug use: No   Sexual activity: Not on file  Other Topics Concern   Not on file  Social History Narrative   Not on file   Social Determinants of Health   Financial Resource Strain: Not on file  Food Insecurity: Not on file  Transportation Needs: Not on file  Physical Activity: Not on file  Stress: Not on file  Social Connections: Not on file  Intimate Partner Violence: Not on file    Family History  Problem Relation Age of Onset   Heart failure Mother     No Known Allergies  Review of Systems  Constitutional: Negative.  Negative for chills, fever, malaise/fatigue and weight loss.  HENT: Negative.  Negative for congestion, hearing loss and sinus  pain.   Eyes: Negative.   Respiratory: Negative.  Negative for cough and shortness of breath.   Cardiovascular: Negative.  Negative for chest pain, palpitations and leg swelling.  Gastrointestinal: Negative.  Negative for abdominal pain, constipation, diarrhea, heartburn, nausea and vomiting.  Genitourinary: Negative.  Negative for dysuria and flank pain.  Musculoskeletal: Negative.  Negative for joint pain and myalgias.  Skin: Negative.   Neurological: Negative.  Negative for dizziness and headaches.  Endo/Heme/Allergies: Negative.   Psychiatric/Behavioral: Negative.  Negative for depression and suicidal ideas. The patient is not nervous/anxious.        Objective:   BP (!) 134/90   Pulse 78   Ht 4\' 11"  (1.499 m)   Wt 98 lb 3.2 oz (44.5 kg)   SpO2 96%   BMI 19.83 kg/m   Vitals:   03/10/23 1001  BP: (!) 134/90  Pulse: 78  Height: 4\' 11"  (1.499 m)  Weight: 98 lb 3.2 oz (44.5 kg)  SpO2: 96%  BMI (Calculated): 19.82    Physical Exam Vitals and nursing note reviewed.  Constitutional:      Appearance: Normal appearance.  HENT:     Head: Normocephalic and atraumatic.     Nose: Nose normal.     Mouth/Throat:     Mouth:  Mucous membranes are moist.     Pharynx: Oropharynx is clear.  Eyes:     Conjunctiva/sclera: Conjunctivae normal.     Pupils: Pupils are equal, round, and reactive to light.  Cardiovascular:     Rate and Rhythm: Normal rate and regular rhythm.     Pulses: Normal pulses.     Heart sounds: Normal heart sounds. No murmur heard. Pulmonary:     Effort: Pulmonary effort is normal.     Breath sounds: Normal breath sounds. No wheezing or rhonchi.  Chest:     Chest wall: No tenderness.  Abdominal:     General: Bowel sounds are normal.     Palpations: Abdomen is soft. There is mass.     Tenderness: There is no abdominal tenderness. There is no right CVA tenderness or left CVA tenderness.  Musculoskeletal:        General: Normal range of motion.     Cervical  back: Normal range of motion.     Right lower leg: No edema.     Left lower leg: No edema.  Skin:    General: Skin is warm and dry.  Neurological:     General: No focal deficit present.     Mental Status: She is alert and oriented to person, place, and time.  Psychiatric:        Mood and Affect: Mood normal.        Behavior: Behavior normal.      No results found for any visits on 03/10/23.  No results found for this or any previous visit (from the past 2160 hour(s)).    Assessment & Plan:  Patient advised to continue taking all her meds. Check labs today. Avoid salt. Monitor blood pressure ,may need to start a medicine next visit Problem List Items Addressed This Visit     Mixed hyperlipidemia - Primary   Relevant Orders   CMP14+EGFR   Lipid Panel w/o Chol/HDL Ratio   Other osteoporosis without current pathological fracture   Relevant Medications   alendronate (FOSAMAX) 70 MG tablet   Other Relevant Orders   Vitamin D (25 hydroxy)   Prehypertension   Relevant Orders   CBC with Diff    Return in about 2 weeks (around 03/24/2023).   Total time spent: 30 minutes  Margaretann Loveless, MD  03/10/2023   This document may have been prepared by [facility] Voice Recognition software and as such may include unintentional dictation errors.

## 2023-03-11 LAB — CMP14+EGFR
ALT: 46 IU/L — ABNORMAL HIGH (ref 0–32)
AST: 56 IU/L — ABNORMAL HIGH (ref 0–40)
Albumin: 4.8 g/dL (ref 3.9–4.9)
Alkaline Phosphatase: 60 IU/L (ref 44–121)
BUN/Creatinine Ratio: 18 (ref 12–28)
BUN: 16 mg/dL (ref 8–27)
Bilirubin Total: 0.9 mg/dL (ref 0.0–1.2)
CO2: 25 mmol/L (ref 20–29)
Calcium: 10.1 mg/dL (ref 8.7–10.3)
Chloride: 101 mmol/L (ref 96–106)
Creatinine, Ser: 0.9 mg/dL (ref 0.57–1.00)
Globulin, Total: 2.5 g/dL (ref 1.5–4.5)
Glucose: 74 mg/dL (ref 70–99)
Potassium: 4.2 mmol/L (ref 3.5–5.2)
Sodium: 140 mmol/L (ref 134–144)
Total Protein: 7.3 g/dL (ref 6.0–8.5)
eGFR: 69 mL/min/{1.73_m2} (ref >59)

## 2023-03-11 LAB — LIPID PANEL W/O CHOL/HDL RATIO
Cholesterol, Total: 201 mg/dL — ABNORMAL HIGH (ref 100–199)
HDL: 82 mg/dL (ref >39)
LDL Chol Calc (NIH): 105 mg/dL — ABNORMAL HIGH (ref 0–99)
Triglycerides: 76 mg/dL (ref 0–149)
VLDL Cholesterol Cal: 14 mg/dL (ref 5–40)

## 2023-03-11 LAB — CBC WITH DIFFERENTIAL/PLATELET
Basophils Absolute: 0.1 10*3/uL (ref 0.0–0.2)
Basos: 1 %
EOS (ABSOLUTE): 0.3 10*3/uL (ref 0.0–0.4)
Eos: 3 %
Hematocrit: 38.1 % (ref 34.0–46.6)
Hemoglobin: 12.3 g/dL (ref 11.1–15.9)
Immature Grans (Abs): 0 10*3/uL (ref 0.0–0.1)
Immature Granulocytes: 0 %
Lymphocytes Absolute: 2.8 10*3/uL (ref 0.7–3.1)
Lymphs: 33 %
MCH: 31 pg (ref 26.6–33.0)
MCHC: 32.3 g/dL (ref 31.5–35.7)
MCV: 96 fL (ref 79–97)
Monocytes Absolute: 0.5 10*3/uL (ref 0.1–0.9)
Monocytes: 6 %
Neutrophils Absolute: 4.9 10*3/uL (ref 1.4–7.0)
Neutrophils: 57 %
Platelets: 296 10*3/uL (ref 150–450)
RBC: 3.97 x10E6/uL (ref 3.77–5.28)
RDW: 12.9 % (ref 11.7–15.4)
WBC: 8.5 10*3/uL (ref 3.4–10.8)

## 2023-03-11 LAB — VITAMIN D 25 HYDROXY (VIT D DEFICIENCY, FRACTURES): Vit D, 25-Hydroxy: 34.8 ng/mL (ref 30.0–100.0)

## 2023-03-15 NOTE — Progress Notes (Signed)
Patient notified

## 2023-03-31 ENCOUNTER — Encounter: Payer: Self-pay | Admitting: Internal Medicine

## 2023-03-31 ENCOUNTER — Ambulatory Visit: Payer: BC Managed Care – PPO | Admitting: Internal Medicine

## 2023-03-31 VITALS — BP 124/84 | HR 85 | Ht 59.0 in | Wt 96.6 lb

## 2023-03-31 DIAGNOSIS — Z1151 Encounter for screening for human papillomavirus (HPV): Secondary | ICD-10-CM | POA: Diagnosis not present

## 2023-03-31 DIAGNOSIS — R030 Elevated blood-pressure reading, without diagnosis of hypertension: Secondary | ICD-10-CM | POA: Diagnosis not present

## 2023-03-31 DIAGNOSIS — Z1272 Encounter for screening for malignant neoplasm of vagina: Secondary | ICD-10-CM | POA: Diagnosis not present

## 2023-03-31 DIAGNOSIS — E782 Mixed hyperlipidemia: Secondary | ICD-10-CM

## 2023-03-31 NOTE — Progress Notes (Signed)
Established Patient Office Visit  Subjective:  Patient ID: Cindy [name], female    DOB: [birth year]  Age: 72 y.o. MRN: [id number]  Chief Complaint  Patient presents with   Follow-up    2 week follow up, PAP.    Patient is here for her blood pressure check and a Pap smear.  She is already had her mammogram. Her blood pressure today looks much better than before. Her last Pap smear was at the time of her second child's birth. She is also scheduled for colonoscopy.    No other concerns at this time.   Past Medical History:  Diagnosis Date   Mixed hyperlipidemia     Past Surgical History:  Procedure Laterality Date   BTL  1992   EYE SURGERY  2015   cataracts    Social History   Socioeconomic History   Marital status: Married    Spouse name: Not on file   Number of children: Not on file   Years of education: Not on file   Highest education level: Not on file  Occupational History   Not on file  Tobacco Use   Smoking status: Never   Smokeless tobacco: Never  Substance and Sexual Activity   Alcohol use: No   Drug use: No   Sexual activity: Not on file  Other Topics Concern   Not on file  Social History Narrative   Not on file   Social Determinants of Health   Financial Resource Strain: Not on file  Food Insecurity: Not on file  Transportation Needs: Not on file  Physical Activity: Not on file  Stress: Not on file  Social Connections: Not on file  Intimate Partner Violence: Not on file    Family History  Problem Relation Age of Onset   Heart failure Mother     No Known Allergies  Review of Systems  Constitutional: Negative.  Negative for chills, fever and malaise/fatigue.  HENT:  Negative for hearing loss and sinus pain.   Eyes: Negative.   Respiratory: Negative.  Negative for cough, shortness of breath and stridor.   Cardiovascular: Negative.  Negative for chest pain, palpitations and leg swelling.  Gastrointestinal: Negative.  Negative for  abdominal pain, constipation, diarrhea, heartburn, nausea and vomiting.  Genitourinary: Negative.  Negative for dysuria and flank pain.  Musculoskeletal: Negative.  Negative for joint pain and myalgias.  Skin: Negative.   Neurological: Negative.  Negative for dizziness and headaches.  Endo/Heme/Allergies: Negative.   Psychiatric/Behavioral: Negative.  Negative for depression and suicidal ideas. The patient is not nervous/anxious.        Objective:   BP 124/84   Pulse 85   Ht 4\' 11"  (1.499 m)   Wt 96 lb 9.6 oz (43.8 kg)   SpO2 98%   BMI 19.51 kg/m   Vitals:   03/31/23 1031  BP: 124/84  Pulse: 85  Height: 4\' 11"  (1.499 m)  Weight: 96 lb 9.6 oz (43.8 kg)  SpO2: 98%  BMI (Calculated): 19.5    Physical Exam Vitals and nursing note reviewed. Exam conducted with a chaperone present.  Constitutional:      Appearance: Normal appearance.  HENT:     Head: Normocephalic and atraumatic.     Nose: Nose normal.     Mouth/Throat:     Mouth: Mucous membranes are moist.     Pharynx: Oropharynx is clear.  Eyes:     Conjunctiva/sclera: Conjunctivae normal.     Pupils: Pupils are equal, round, and reactive  to light.  Cardiovascular:     Rate and Rhythm: Normal rate and regular rhythm.     Pulses: Normal pulses.     Heart sounds: Normal heart sounds. No murmur heard. Pulmonary:     Effort: Pulmonary effort is normal.     Breath sounds: Normal breath sounds. No wheezing.  Chest:  Breasts:    Right: Normal. No swelling, bleeding, inverted nipple, mass, nipple discharge, skin change or tenderness.     Left: Normal. No swelling, bleeding, inverted nipple, mass, nipple discharge, skin change or tenderness.  Abdominal:     General: Bowel sounds are normal.     Palpations: Abdomen is soft.     Tenderness: There is no abdominal tenderness. There is no right CVA tenderness or left CVA tenderness.     Hernia: There is no hernia in the left inguinal area or right inguinal area.   Genitourinary:    General: Normal vulva.     Pubic Area: No rash or pubic lice.      Labia:        Right: No rash, tenderness, lesion or injury.        Left: No rash, tenderness, lesion or injury.      Urethra: No prolapse.     Vagina: Normal. No signs of injury and foreign body. No vaginal discharge, erythema, tenderness, bleeding, lesions or prolapsed vaginal walls.     Cervix: Normal.     Uterus: Normal.      Adnexa: Right adnexa normal and left adnexa normal.       Right: No mass, tenderness or fullness.         Left: No mass, tenderness or fullness.    Musculoskeletal:        General: Normal range of motion.     Cervical back: Normal range of motion.     Right lower leg: No edema.     Left lower leg: No edema.  Lymphadenopathy:     Upper Body:     Right upper body: No supraclavicular, axillary or pectoral adenopathy.     Left upper body: No supraclavicular, axillary or pectoral adenopathy.     Lower Body: No right inguinal adenopathy. No left inguinal adenopathy.  Skin:    General: Skin is warm and dry.  Neurological:     General: No focal deficit present.     Mental Status: She is alert and oriented to person, place, and time.  Psychiatric:        Mood and Affect: Mood normal.        Behavior: Behavior normal.      No results found for any visits on 03/31/23.  Recent Results (from the past 2160 hour(s))  CMP14+EGFR     Status: Abnormal   Collection Time: 03/10/23 10:42 AM  Result Value Ref Range   Glucose 74 70 - 99 mg/dL   BUN 16 8 - 27 mg/dL   Creatinine, Ser 8.11 0.57 - 1.00 mg/dL   eGFR 69 >91 YN/WGN/5.62   BUN/Creatinine Ratio 18 12 - 28   Sodium 140 134 - 144 mmol/L   Potassium 4.2 3.5 - 5.2 mmol/L   Chloride 101 96 - 106 mmol/L   CO2 25 20 - 29 mmol/L   Calcium 10.1 8.7 - 10.3 mg/dL   Total Protein 7.3 6.0 - 8.5 g/dL   Albumin 4.8 3.9 - 4.9 g/dL   Globulin, Total 2.5 1.5 - 4.5 g/dL   Bilirubin Total 0.9 0.0 - 1.2 mg/dL  Alkaline Phosphatase 60 44  - 121 IU/L   AST 56 (H) 0 - 40 IU/L   ALT 46 (H) 0 - 32 IU/L  Lipid Panel w/o Chol/HDL Ratio     Status: Abnormal   Collection Time: 03/10/23 10:42 AM  Result Value Ref Range   Cholesterol, Total 201 (H) 100 - 199 mg/dL   Triglycerides 76 0 - 149 mg/dL   HDL 82 >47 mg/dL   VLDL Cholesterol Cal 14 5 - 40 mg/dL   LDL Chol Calc (NIH) 829 (H) 0 - 99 mg/dL  Vitamin D (25 hydroxy)     Status: None   Collection Time: 03/10/23 10:42 AM  Result Value Ref Range   Vit D, 25-Hydroxy 34.8 30.0 - 100.0 ng/mL    Comment: Vitamin D deficiency has been defined by the Institute of Medicine and an Endocrine Society practice guideline as a level of serum 25-OH vitamin D less than 20 ng/mL (1,2). The Endocrine Society went on to further define vitamin D insufficiency as a level between 21 and 29 ng/mL (2). 1. IOM (Institute of Medicine). 2010. Dietary reference    intakes for calcium and D. [place]: The    [employer]. 2. Holick MF, Binkley , Bischoff-Ferrari HA, et al.    Evaluation, treatment, and prevention of vitamin D    deficiency: an Endocrine Society clinical practice    guideline. JCEM. 2011 Jul; 96(7):1911-30.   CBC with Diff     Status: None   Collection Time: 03/10/23 11:55 AM  Result Value Ref Range   WBC 8.5 3.4 - 10.8 x10E3/uL   RBC 3.97 3.77 - 5.28 x10E6/uL   Hemoglobin 12.3 11.1 - 15.9 g/dL   Hematocrit 56.2 13.0 - 46.6 %   MCV 96 79 - 97 fL   MCH 31.0 26.6 - 33.0 pg   MCHC 32.3 31.5 - 35.7 g/dL   RDW 86.5 78.4 - 69.6 %   Platelets 296 150 - 450 x10E3/uL   Neutrophils 57 Not Estab. %   Lymphs 33 Not Estab. %   Monocytes 6 Not Estab. %   Eos 3 Not Estab. %   Basos 1 Not Estab. %   Neutrophils Absolute 4.9 1.4 - 7.0 x10E3/uL   Lymphocytes Absolute 2.8 0.7 - 3.1 x10E3/uL   Monocytes Absolute 0.5 0.1 - 0.9 x10E3/uL   EOS (ABSOLUTE) 0.3 0.0 - 0.4 x10E3/uL   Basophils Absolute 0.1 0.0 - 0.2 x10E3/uL   Immature Granulocytes 0 Not Estab. %   Immature Grans  (Abs) 0.0 0.0 - 0.1 x10E3/uL      Assessment & Plan:  Patient to continue all medications at home ,monitor blood pressure. Problem List Items Addressed This Visit     Mixed hyperlipidemia   Prehypertension   Other Visit Diagnoses     Screening for HPV (human papillomavirus)    -  Primary   Relevant Orders   IGP, Aptima HPV   Vaginal Pap smear       Relevant Orders   IGP, Aptima HPV       Return in about 3 months (around 06/30/2023).   Total time spent: 30 minutes  Margaretann Loveless, MD  03/31/2023   This document may have been prepared by [facility] Voice Recognition software and as such may include unintentional dictation errors.

## 2023-04-07 LAB — IGP, APTIMA HPV
HPV Aptima: NEGATIVE
PAP Smear Comment: 0

## 2023-04-07 LAB — SPECIMEN STATUS REPORT

## 2023-04-08 NOTE — Progress Notes (Signed)
Patient notified

## 2023-05-13 ENCOUNTER — Ambulatory Visit: Payer: BC Managed Care – PPO

## 2023-05-13 DIAGNOSIS — D122 Benign neoplasm of ascending colon: Secondary | ICD-10-CM | POA: Diagnosis not present

## 2023-05-13 DIAGNOSIS — K640 First degree hemorrhoids: Secondary | ICD-10-CM | POA: Diagnosis not present

## 2023-05-13 DIAGNOSIS — Z1211 Encounter for screening for malignant neoplasm of colon: Secondary | ICD-10-CM | POA: Diagnosis present

## 2023-06-03 ENCOUNTER — Encounter: Payer: Self-pay | Admitting: Podiatry

## 2023-06-03 ENCOUNTER — Ambulatory Visit: Payer: BC Managed Care – PPO | Admitting: Podiatry

## 2023-06-03 VITALS — Ht 59.0 in | Wt 96.6 lb

## 2023-06-03 DIAGNOSIS — M778 Other enthesopathies, not elsewhere classified: Secondary | ICD-10-CM

## 2023-06-03 MED ORDER — BETAMETHASONE SOD PHOS & ACET 6 (3-3) MG/ML IJ SUSP
3.0000 mg | Freq: Once | INTRAMUSCULAR | Status: AC
  Administered 2023-06-03: 3 mg via INTRA_ARTICULAR

## 2023-06-03 NOTE — Progress Notes (Signed)
   Chief Complaint  Patient presents with   Foot Pain    Pt is here to get bilateral foot injections for pain.    HPI: 72 y.o. female presenting today for follow-up evaluation of right foot pain.  Patient states that last visit the injections helped significantly.  She did not have any pain or tenderness for about 2 years.  Over the last few months she has had an increase of pain and tenderness and requesting injection today.  Past Medical History:  Diagnosis Date   Mixed hyperlipidemia      Physical Exam: General: The patient is alert and oriented x3 in no acute distress.  Dermatology: Skin is warm, dry and supple bilateral lower extremities. Negative for open lesions or macerations.  Vascular: Palpable pedal pulses bilaterally. No edema or erythema noted. Capillary refill within normal limits.  Neurological: Grossly intact via light touch  Musculoskeletal Exam: Range of motion within normal limits to all pedal and ankle joints bilateral. Muscle strength 5/5 in all groups bilateral.  There is pain on palpation throughout the right midtarsal joint of the right foot    Assessment: 1.  Capsulitis right midtarsal joint   Plan of Care:  -Patient evaluated -Injection of 0.5 cc Celestone Soluspan injected into the right midtarsal joint -Declined oral NSAIDs.  Patient states that last visit the injection alleviated all of her symptoms and pain and she did not have to take any anti-inflammatory -Continue good supportive shoes and sneakers -Return to clinic as needed  *Works at [employer] on [employer].      Felecia Shelling, DPM [facility]  Dr. Felecia Shelling, DPM    [address] [place], [place] [address]                Office [phone]  Fax [phone]

## 2023-06-20 ENCOUNTER — Other Ambulatory Visit: Payer: Self-pay | Admitting: Internal Medicine

## 2023-06-20 DIAGNOSIS — E782 Mixed hyperlipidemia: Secondary | ICD-10-CM

## 2023-06-30 ENCOUNTER — Ambulatory Visit: Payer: BC Managed Care – PPO | Admitting: Internal Medicine

## 2023-06-30 ENCOUNTER — Encounter: Payer: Self-pay | Admitting: Internal Medicine

## 2023-06-30 VITALS — BP 122/84 | HR 81 | Ht 59.0 in | Wt 96.6 lb

## 2023-06-30 DIAGNOSIS — R030 Elevated blood-pressure reading, without diagnosis of hypertension: Secondary | ICD-10-CM | POA: Diagnosis not present

## 2023-06-30 DIAGNOSIS — E782 Mixed hyperlipidemia: Secondary | ICD-10-CM | POA: Diagnosis not present

## 2023-06-30 NOTE — Progress Notes (Signed)
Established Patient Office Visit  Subjective:  Patient ID: Cindy [name], female    DOB: September 07, 1950  Age: 72 y.o. MRN: [id number]  Chief Complaint  Patient presents with   Follow-up    Patient is here for her follow-up today.  She is feeling well and has no new complaints.  Patient takes her medications regularly.  She is fasting for blood work today.  She is up-to-date on her DEXA and mammogram.  Will get labs today. Her PHQ-9/GAD score is 0/0.    No other concerns at this time.   Past Medical History:  Diagnosis Date   Mixed hyperlipidemia     Past Surgical History:  Procedure Laterality Date   BTL  1992   EYE SURGERY  2015   cataracts    Social History   Socioeconomic History   Marital status: Married    Spouse name: Not on file   Number of children: Not on file   Years of education: Not on file   Highest education level: Not on file  Occupational History   Not on file  Tobacco Use   Smoking status: Never   Smokeless tobacco: Never  Substance and Sexual Activity   Alcohol use: No   Drug use: No   Sexual activity: Not on file  Other Topics Concern   Not on file  Social History Narrative   Not on file   Social Drivers of Health   Financial Resource Strain: Not on file  Food Insecurity: Not on file  Transportation Needs: Not on file  Physical Activity: Not on file  Stress: Not on file  Social Connections: Not on file  Intimate Partner Violence: Not on file    Family History  Problem Relation Age of Onset   Heart failure Mother     No Known Allergies  Outpatient Medications Prior to Visit  Medication Sig   Acetaminophen (TYLENOL 8 HOUR PO) Take 200 mg by mouth as needed.   alendronate (FOSAMAX) 70 MG tablet Take 70 mg by mouth once a week.   CALCIUM-VITAMIN D PO Take by mouth.   Multiple Vitamin (MULTIVITAMIN) capsule Take 1 capsule by mouth daily.   rosuvastatin (CRESTOR) 10 MG tablet Take 1 tablet by mouth once daily   No  facility-administered medications prior to visit.    Review of Systems  Constitutional: Negative.  Negative for chills, fever, malaise/fatigue and weight loss.  HENT: Negative.  Negative for hearing loss and sinus pain.   Eyes: Negative.   Respiratory: Negative.  Negative for cough, shortness of breath and stridor.   Cardiovascular: Negative.  Negative for chest pain, palpitations and leg swelling.  Gastrointestinal: Negative.  Negative for abdominal pain, constipation, diarrhea, heartburn, nausea and vomiting.  Genitourinary: Negative.  Negative for dysuria and flank pain.  Musculoskeletal: Negative.  Negative for joint pain and myalgias.  Skin: Negative.   Neurological: Negative.  Negative for dizziness and headaches.  Endo/Heme/Allergies: Negative.   Psychiatric/Behavioral: Negative.  Negative for depression and suicidal ideas. The patient is not nervous/anxious.        Objective:   BP 122/84   Pulse 81   Ht 4\' 11"  (1.499 m)   Wt 96 lb 9.6 oz (43.8 kg)   SpO2 99%   BMI 19.51 kg/m   Vitals:   06/30/23 1026  BP: 122/84  Pulse: 81  Height: 4\' 11"  (1.499 m)  Weight: 96 lb 9.6 oz (43.8 kg)  SpO2: 99%  BMI (Calculated): 19.5    Physical Exam  Vitals and nursing note reviewed.  Constitutional:      Appearance: Normal appearance.  HENT:     Head: Normocephalic and atraumatic.     Nose: Nose normal.     Mouth/Throat:     Mouth: Mucous membranes are moist.     Pharynx: Oropharynx is clear.  Eyes:     Conjunctiva/sclera: Conjunctivae normal.     Pupils: Pupils are equal, round, and reactive to light.  Cardiovascular:     Rate and Rhythm: Normal rate and regular rhythm.     Pulses: Normal pulses.     Heart sounds: Normal heart sounds. No murmur heard. Pulmonary:     Effort: Pulmonary effort is normal.     Breath sounds: Normal breath sounds. No wheezing.  Abdominal:     General: Bowel sounds are normal.     Palpations: Abdomen is soft.     Tenderness: There is no  abdominal tenderness. There is no right CVA tenderness or left CVA tenderness.  Musculoskeletal:        General: Normal range of motion.     Cervical back: Normal range of motion.     Right lower leg: No edema.     Left lower leg: No edema.  Skin:    General: Skin is warm and dry.  Neurological:     General: No focal deficit present.     Mental Status: She is alert and oriented to person, place, and time.  Psychiatric:        Mood and Affect: Mood normal.        Behavior: Behavior normal.      No results found for any visits on 06/30/23.  No results found for this or any previous visit (from the past 2160 hours).    Assessment & Plan:  Patient advised to continue taking her medications. Monitor blood pressure at home.  Avoid salt. Problem List Items Addressed This Visit     Mixed hyperlipidemia - Primary   Relevant Orders   CMP14+EGFR   Lipid Panel w/o Chol/HDL Ratio   Prehypertension   Relevant Orders   CMP14+EGFR    Return in about 3 months (around 09/28/2023).   Total time spent: 25 minutes  Margaretann Loveless, MD  06/30/2023   This document may have been prepared by [facility] Voice Recognition software and as such may include unintentional dictation errors.

## 2023-07-01 ENCOUNTER — Other Ambulatory Visit: Payer: Self-pay | Admitting: Internal Medicine

## 2023-07-01 DIAGNOSIS — R748 Abnormal levels of other serum enzymes: Secondary | ICD-10-CM

## 2023-07-01 LAB — CMP14+EGFR
ALT: 48 [IU]/L — ABNORMAL HIGH (ref 0–32)
AST: 71 [IU]/L — ABNORMAL HIGH (ref 0–40)
Albumin: 4.5 g/dL (ref 3.9–4.9)
Alkaline Phosphatase: 64 [IU]/L (ref 44–121)
BUN/Creatinine Ratio: 26 (ref 12–28)
BUN: 24 mg/dL (ref 8–27)
Bilirubin Total: 0.8 mg/dL (ref 0.0–1.2)
CO2: 23 mmol/L (ref 20–29)
Calcium: 9.8 mg/dL (ref 8.7–10.3)
Chloride: 101 mmol/L (ref 96–106)
Creatinine, Ser: 0.94 mg/dL (ref 0.57–1.00)
Globulin, Total: 2.4 g/dL (ref 1.5–4.5)
Glucose: 79 mg/dL (ref 70–99)
Potassium: 4.6 mmol/L (ref 3.5–5.2)
Sodium: 137 mmol/L (ref 134–144)
Total Protein: 6.9 g/dL (ref 6.0–8.5)
eGFR: 65 mL/min/{1.73_m2} (ref >59)

## 2023-07-01 LAB — LIPID PANEL W/O CHOL/HDL RATIO
Cholesterol, Total: 216 mg/dL — ABNORMAL HIGH (ref 100–199)
HDL: 92 mg/dL (ref >39)
LDL Chol Calc (NIH): 113 mg/dL — ABNORMAL HIGH (ref 0–99)
Triglycerides: 61 mg/dL (ref 0–149)
VLDL Cholesterol Cal: 11 mg/dL (ref 5–40)

## 2023-07-22 ENCOUNTER — Ambulatory Visit
Admission: RE | Admit: 2023-07-22 | Discharge: 2023-07-22 | Disposition: A | Discharge status: Home / Self Care | Payer: BC Managed Care – PPO | Source: Ambulatory Visit | Attending: Internal Medicine | Admitting: Internal Medicine

## 2023-07-22 DIAGNOSIS — R748 Abnormal levels of other serum enzymes: Secondary | ICD-10-CM | POA: Insufficient documentation

## 2023-07-22 NOTE — Progress Notes (Signed)
 Patient notified

## 2023-11-03 ENCOUNTER — Ambulatory Visit: Payer: BC Managed Care – PPO | Admitting: Internal Medicine

## 2023-11-03 ENCOUNTER — Encounter: Payer: Self-pay | Admitting: Internal Medicine

## 2023-11-03 VITALS — BP 98/66 | HR 110 | Ht 59.0 in | Wt 96.6 lb

## 2023-11-03 DIAGNOSIS — R030 Elevated blood-pressure reading, without diagnosis of hypertension: Secondary | ICD-10-CM

## 2023-11-03 DIAGNOSIS — E782 Mixed hyperlipidemia: Secondary | ICD-10-CM | POA: Diagnosis not present

## 2023-11-03 DIAGNOSIS — R000 Tachycardia, unspecified: Secondary | ICD-10-CM

## 2023-11-03 DIAGNOSIS — Z1231 Encounter for screening mammogram for malignant neoplasm of breast: Secondary | ICD-10-CM

## 2023-11-03 NOTE — Progress Notes (Signed)
 Established Patient Office Visit  Subjective:  Patient ID: Cindy [name], female    DOB: 22-Sep-1950  Age: 73 y.o. MRN: [id number]  Chief Complaint  Patient presents with   Follow-up    4 month follow up    Patient comes in for her follow-up today.  Her blood pressure is slightly low but her pulse is fast.  She admits to not eating or drinking anything yet.  Could be related to dehydration.  Patient is fasting for blood work today. Also need to schedule her mammogram.  She denies any chest pain, and no shortness of breath.    No other concerns at this time.   Past Medical History:  Diagnosis Date   Mixed hyperlipidemia     Past Surgical History:  Procedure Laterality Date   BTL  1992   EYE SURGERY  2015   cataracts    Social History   Socioeconomic History   Marital status: Married    Spouse name: Not on file   Number of children: Not on file   Years of education: Not on file   Highest education level: Not on file  Occupational History   Not on file  Tobacco Use   Smoking status: Never   Smokeless tobacco: Never  Substance and Sexual Activity   Alcohol use: No   Drug use: No   Sexual activity: Not on file  Other Topics Concern   Not on file  Social History Narrative   Not on file   Social Drivers of Health   Financial Resource Strain: Not on file  Food Insecurity: Not on file  Transportation Needs: Not on file  Physical Activity: Not on file  Stress: Not on file  Social Connections: Not on file  Intimate Partner Violence: Not on file    Family History  Problem Relation Age of Onset   Heart failure Mother     No Known Allergies  Outpatient Medications Prior to Visit  Medication Sig   Acetaminophen (TYLENOL 8 HOUR PO) Take 200 mg by mouth as needed.   alendronate  (FOSAMAX ) 70 MG tablet Take 70 mg by mouth once a week.   CALCIUM -VITAMIN D  PO Take by mouth.   Multiple Vitamin (MULTIVITAMIN) capsule Take 1 capsule by mouth daily.    rosuvastatin  (CRESTOR ) 10 MG tablet Take 1 tablet by mouth once daily   No facility-administered medications prior to visit.    Review of Systems  Constitutional: Negative.  Negative for chills, fever, malaise/fatigue and weight loss.  HENT: Negative.  Negative for sore throat.   Eyes: Negative.   Respiratory: Negative.  Negative for cough and shortness of breath.   Cardiovascular:  Positive for palpitations. Negative for chest pain and leg swelling.  Gastrointestinal: Negative.  Negative for abdominal pain, constipation, diarrhea, heartburn, nausea and vomiting.  Genitourinary: Negative.  Negative for dysuria and flank pain.  Musculoskeletal: Negative.  Negative for joint pain and myalgias.  Skin: Negative.   Neurological: Negative.  Negative for dizziness, tingling, tremors and headaches.  Endo/Heme/Allergies: Negative.   Psychiatric/Behavioral: Negative.  Negative for depression and suicidal ideas. The patient is not nervous/anxious.        Objective:   BP 98/66   Pulse (!) 110   Ht 4\' 11"  (1.499 m)   Wt 96 lb 9.6 oz (43.8 kg)   SpO2 98%   BMI 19.51 kg/m   Vitals:   11/03/23 1030  BP: 98/66  Pulse: (!) 110  Height: 4\' 11"  (1.499 m)  Weight:  96 lb 9.6 oz (43.8 kg)  SpO2: 98%  BMI (Calculated): 19.5    Physical Exam Vitals and nursing note reviewed.  Constitutional:      Appearance: Normal appearance.  HENT:     Head: Normocephalic and atraumatic.     Nose: Nose normal.     Mouth/Throat:     Mouth: Mucous membranes are moist.     Pharynx: Oropharynx is clear.  Eyes:     Conjunctiva/sclera: Conjunctivae normal.     Pupils: Pupils are equal, round, and reactive to light.  Cardiovascular:     Rate and Rhythm: Normal rate and regular rhythm.     Pulses: Normal pulses.     Heart sounds: Normal heart sounds. No murmur heard. Pulmonary:     Effort: Pulmonary effort is normal.     Breath sounds: Normal breath sounds. No wheezing.  Abdominal:     General: Bowel  sounds are normal.     Palpations: Abdomen is soft.     Tenderness: There is no abdominal tenderness. There is no right CVA tenderness or left CVA tenderness.  Musculoskeletal:        General: Normal range of motion.     Cervical back: Normal range of motion.     Right lower leg: No edema.     Left lower leg: No edema.  Skin:    General: Skin is warm and dry.  Neurological:     General: No focal deficit present.     Mental Status: She is alert and oriented to person, place, and time.  Psychiatric:        Mood and Affect: Mood normal.        Behavior: Behavior normal.      No results found for any visits on 11/03/23.  No results found for this or any previous visit (from the past 2160 hours).    Assessment & Plan:  Check labs today.  Continue medications.  Patient to return in 3 weeks in the afternoon to check pulse and blood pressure. Problem List Items Addressed This Visit     Mixed hyperlipidemia   Relevant Orders   CMP14+EGFR   Lipid Panel w/o Chol/HDL Ratio   Prehypertension   Other Visit Diagnoses       Tachycardia    -  Primary   Relevant Orders   CBC with Diff   TSH+T4F+T3Free     Breast cancer screening by mammogram       Relevant Orders   MM 3D SCREENING MAMMOGRAM BILATERAL BREAST       Return in about 3 weeks (around 11/24/2023).   Total time spent: 30 minutes  Aisha Hove, MD  11/03/2023   This document may have been prepared by [facility] Voice Recognition software and as such may include unintentional dictation errors.

## 2023-11-04 LAB — CMP14+EGFR
ALT: 29 IU/L (ref 0–32)
AST: 50 IU/L — ABNORMAL HIGH (ref 0–40)
Albumin: 4.4 g/dL (ref 3.9–4.9)
Alkaline Phosphatase: 66 IU/L (ref 44–121)
BUN/Creatinine Ratio: 18 (ref 12–28)
BUN: 18 mg/dL (ref 8–27)
Bilirubin Total: 0.9 mg/dL (ref 0.0–1.2)
CO2: 23 mmol/L (ref 20–29)
Calcium: 9.6 mg/dL (ref 8.7–10.3)
Chloride: 103 mmol/L (ref 96–106)
Creatinine, Ser: 1.02 mg/dL — ABNORMAL HIGH (ref 0.57–1.00)
Globulin, Total: 2.3 g/dL (ref 1.5–4.5)
Glucose: 92 mg/dL (ref 70–99)
Potassium: 3.6 mmol/L (ref 3.5–5.2)
Sodium: 142 mmol/L (ref 134–144)
Total Protein: 6.7 g/dL (ref 6.0–8.5)
eGFR: 59 mL/min/{1.73_m2} — ABNORMAL LOW (ref >59)

## 2023-11-04 LAB — LIPID PANEL W/O CHOL/HDL RATIO
Cholesterol, Total: 198 mg/dL (ref 100–199)
HDL: 79 mg/dL (ref >39)
LDL Chol Calc (NIH): 99 mg/dL (ref 0–99)
Triglycerides: 115 mg/dL (ref 0–149)
VLDL Cholesterol Cal: 20 mg/dL (ref 5–40)

## 2023-11-04 LAB — CBC WITH DIFFERENTIAL/PLATELET
Basophils Absolute: 0.1 10*3/uL (ref 0.0–0.2)
Basos: 1 %
EOS (ABSOLUTE): 0.3 10*3/uL (ref 0.0–0.4)
Eos: 3 %
Hematocrit: 35.2 % (ref 34.0–46.6)
Hemoglobin: 11.8 g/dL (ref 11.1–15.9)
Immature Grans (Abs): 0 10*3/uL (ref 0.0–0.1)
Immature Granulocytes: 0 %
Lymphocytes Absolute: 2.4 10*3/uL (ref 0.7–3.1)
Lymphs: 26 %
MCH: 32.4 pg (ref 26.6–33.0)
MCHC: 33.5 g/dL (ref 31.5–35.7)
MCV: 97 fL (ref 79–97)
Monocytes Absolute: 0.5 10*3/uL (ref 0.1–0.9)
Monocytes: 6 %
Neutrophils Absolute: 5.9 10*3/uL (ref 1.4–7.0)
Neutrophils: 64 %
Platelets: 282 10*3/uL (ref 150–450)
RBC: 3.64 x10E6/uL — ABNORMAL LOW (ref 3.77–5.28)
RDW: 12.4 % (ref 11.7–15.4)
WBC: 9.3 10*3/uL (ref 3.4–10.8)

## 2023-11-04 LAB — TSH+T4F+T3FREE
Free T4: 0.36 ng/dL — ABNORMAL LOW (ref 0.82–1.77)
T3, Free: 1.4 pg/mL — ABNORMAL LOW (ref 2.0–4.4)
TSH: 3.87 u[IU]/mL (ref 0.450–4.500)

## 2023-11-04 NOTE — Progress Notes (Signed)
 Patient notified

## 2023-11-25 ENCOUNTER — Encounter: Payer: Self-pay | Admitting: Internal Medicine

## 2023-11-25 ENCOUNTER — Ambulatory Visit: Admitting: Internal Medicine

## 2023-11-25 VITALS — BP 110/82 | HR 71 | Ht 59.0 in | Wt 98.4 lb

## 2023-11-25 DIAGNOSIS — E782 Mixed hyperlipidemia: Secondary | ICD-10-CM

## 2023-11-25 DIAGNOSIS — M818 Other osteoporosis without current pathological fracture: Secondary | ICD-10-CM | POA: Diagnosis not present

## 2023-11-25 DIAGNOSIS — R030 Elevated blood-pressure reading, without diagnosis of hypertension: Secondary | ICD-10-CM | POA: Diagnosis not present

## 2023-11-25 NOTE — Progress Notes (Signed)
 Established Patient Office Visit  Subjective:  Patient ID: ESTEE SOIFER, female    DOB: Jan 11, 1951  Age: 73 y.o. MRN: [id number]  Chief Complaint  Patient presents with   Follow-up    3 week follow up    Patient comes in for her follow-up today.  She is feeling well and has no new complaints.  Her blood pressure and pulse are within normal limits at this time.  Her labs were done at the last visit and they are stable. Denies headache or dizziness, no chest pain and no palpitations. Patient is currently on Fosamax , but was not taking as advised.  From now on will take it first thing in the morning with a large glass of water empty stomach, and not to eat or drink or take any other medicines for 1 hour. Monitor H/H.    No other concerns at this time.   Past Medical History:  Diagnosis Date   Mixed hyperlipidemia     Past Surgical History:  Procedure Laterality Date   BTL  1992   EYE SURGERY  2015   cataracts    Social History   Socioeconomic History   Marital status: Married    Spouse name: Not on file   Number of children: Not on file   Years of education: Not on file   Highest education level: Not on file  Occupational History   Not on file  Tobacco Use   Smoking status: Never   Smokeless tobacco: Never  Substance and Sexual Activity   Alcohol use: No   Drug use: No   Sexual activity: Not on file  Other Topics Concern   Not on file  Social History Narrative   Not on file   Social Drivers of Health   Financial Resource Strain: Not on file  Food Insecurity: Not on file  Transportation Needs: Not on file  Physical Activity: Not on file  Stress: Not on file  Social Connections: Not on file  Intimate Partner Violence: Not on file    Family History  Problem Relation Age of Onset   Heart failure Mother     No Known Allergies  Outpatient Medications Prior to Visit  Medication Sig   Acetaminophen (TYLENOL 8 HOUR PO) Take 200 mg by mouth as needed.    alendronate  (FOSAMAX ) 70 MG tablet Take 70 mg by mouth once a week.   CALCIUM -VITAMIN D  PO Take by mouth.   Multiple Vitamin (MULTIVITAMIN) capsule Take 1 capsule by mouth daily.   rosuvastatin  (CRESTOR ) 10 MG tablet Take 1 tablet by mouth once daily   No facility-administered medications prior to visit.    Review of Systems  Constitutional: Negative.  Negative for chills, fever and weight loss.  HENT: Negative.  Negative for congestion and sore throat.   Eyes: Negative.   Respiratory: Negative.  Negative for cough and shortness of breath.   Cardiovascular: Negative.  Negative for chest pain, palpitations and leg swelling.  Gastrointestinal: Negative.  Negative for abdominal pain, constipation, diarrhea, heartburn, nausea and vomiting.  Genitourinary: Negative.  Negative for dysuria and flank pain.  Musculoskeletal: Negative.  Negative for joint pain and myalgias.  Skin: Negative.   Neurological: Negative.  Negative for dizziness, tingling, tremors and headaches.  Endo/Heme/Allergies: Negative.   Psychiatric/Behavioral: Negative.  Negative for depression and suicidal ideas. The patient is not nervous/anxious.        Objective:   BP 110/82   Pulse 71   Ht 4\' 11"  (1.499 m)  Wt 98 lb 6.4 oz (44.6 kg)   SpO2 98%   BMI 19.87 kg/m   Vitals:   11/25/23 1309  BP: 110/82  Pulse: 71  Height: 4\' 11"  (1.499 m)  Weight: 98 lb 6.4 oz (44.6 kg)  SpO2: 98%  BMI (Calculated): 19.86    Physical Exam Vitals and nursing note reviewed.  Constitutional:      Appearance: Normal appearance.  HENT:     Head: Normocephalic and atraumatic.     Nose: Nose normal.     Mouth/Throat:     Mouth: Mucous membranes are moist.     Pharynx: Oropharynx is clear.  Eyes:     Conjunctiva/sclera: Conjunctivae normal.     Pupils: Pupils are equal, round, and reactive to light.  Cardiovascular:     Rate and Rhythm: Normal rate and regular rhythm.     Pulses: Normal pulses.     Heart sounds:  Normal heart sounds. No murmur heard. Pulmonary:     Effort: Pulmonary effort is normal.     Breath sounds: Normal breath sounds. No wheezing.  Abdominal:     General: Bowel sounds are normal.     Palpations: Abdomen is soft.     Tenderness: There is no abdominal tenderness. There is no right CVA tenderness or left CVA tenderness.  Musculoskeletal:        General: Normal range of motion.     Cervical back: Normal range of motion.     Right lower leg: No edema.     Left lower leg: No edema.  Skin:    General: Skin is warm and dry.  Neurological:     General: No focal deficit present.     Mental Status: She is alert and oriented to person, place, and time.  Psychiatric:        Mood and Affect: Mood normal.        Behavior: Behavior normal.      No results found for any visits on 11/25/23.  Recent Results (from the past 2160 hours)  CBC with Diff     Status: Abnormal   Collection Time: 11/03/23 11:05 AM  Result Value Ref Range   WBC 9.3 3.4 - 10.8 x10E3/uL   RBC 3.64 (L) 3.77 - 5.28 x10E6/uL   Hemoglobin 11.8 11.1 - 15.9 g/dL   Hematocrit 95.6 38.7 - 46.6 %   MCV 97 79 - 97 fL   MCH 32.4 26.6 - 33.0 pg   MCHC 33.5 31.5 - 35.7 g/dL   RDW 56.4 33.2 - 95.1 %   Platelets 282 150 - 450 x10E3/uL   Neutrophils 64 Not Estab. %   Lymphs 26 Not Estab. %   Monocytes 6 Not Estab. %   Eos 3 Not Estab. %   Basos 1 Not Estab. %   Neutrophils Absolute 5.9 1.4 - 7.0 x10E3/uL   Lymphocytes Absolute 2.4 0.7 - 3.1 x10E3/uL   Monocytes Absolute 0.5 0.1 - 0.9 x10E3/uL   EOS (ABSOLUTE) 0.3 0.0 - 0.4 x10E3/uL   Basophils Absolute 0.1 0.0 - 0.2 x10E3/uL   Immature Granulocytes 0 Not Estab. %   Immature Grans (Abs) 0.0 0.0 - 0.1 x10E3/uL  CMP14+EGFR     Status: Abnormal   Collection Time: 11/03/23 11:05 AM  Result Value Ref Range   Glucose 92 70 - 99 mg/dL   BUN 18 8 - 27 mg/dL   Creatinine, Ser 8.84 (H) 0.57 - 1.00 mg/dL   eGFR 59 (L) >16 SA/YTK/1.60   BUN/Creatinine Ratio  18 12 - 28    Sodium 142 134 - 144 mmol/L   Potassium 3.6 3.5 - 5.2 mmol/L   Chloride 103 96 - 106 mmol/L   CO2 23 20 - 29 mmol/L   Calcium  9.6 8.7 - 10.3 mg/dL   Total Protein 6.7 6.0 - 8.5 g/dL   Albumin 4.4 3.9 - 4.9 g/dL   Globulin, Total 2.3 1.5 - 4.5 g/dL   Bilirubin Total 0.9 0.0 - 1.2 mg/dL   Alkaline Phosphatase 66 44 - 121 IU/L   AST 50 (H) 0 - 40 IU/L   ALT 29 0 - 32 IU/L  Lipid Panel w/o Chol/HDL Ratio     Status: None   Collection Time: 11/03/23 11:05 AM  Result Value Ref Range   Cholesterol, Total 198 100 - 199 mg/dL   Triglycerides 952 0 - 149 mg/dL   HDL 79 >84 mg/dL   VLDL Cholesterol Cal 20 5 - 40 mg/dL   LDL Chol Calc (NIH) 99 0 - 99 mg/dL  XLK+[id number]+[id number]     Status: Abnormal   Collection Time: 11/03/23 11:05 AM  Result Value Ref Range   TSH 3.870 0.450 - 4.500 uIU/mL   T3, Free 1.4 (L) 2.0 - 4.4 pg/mL   Free T4 0.36 (L) 0.82 - 1.77 ng/dL      Assessment & Plan:  Continue current medications.  Adequate hydration and p.o. intake emphasized. Problem List Items Addressed This Visit     Mixed hyperlipidemia - Primary   Other osteoporosis without current pathological fracture   Prehypertension    Return in about 3 months (around 02/25/2024).   Total time spent: 25 minutes  Aisha Hove, MD  11/25/2023   This document may have been prepared by [facility] Voice Recognition software and as such may include unintentional dictation errors.

## 2023-12-19 ENCOUNTER — Other Ambulatory Visit: Payer: Self-pay

## 2023-12-19 MED ORDER — ALENDRONATE SODIUM 70 MG PO TABS: 70.0000 mg | ORAL_TABLET | ORAL | 3 refills | Status: AC

## 2023-12-23 ENCOUNTER — Ambulatory Visit
Admission: RE | Admit: 2023-12-23 | Discharge: 2023-12-23 | Disposition: A | Discharge status: Home / Self Care | Source: Ambulatory Visit | Attending: Internal Medicine | Admitting: Internal Medicine

## 2023-12-23 DIAGNOSIS — Z1231 Encounter for screening mammogram for malignant neoplasm of breast: Secondary | ICD-10-CM | POA: Insufficient documentation

## 2023-12-28 ENCOUNTER — Other Ambulatory Visit: Payer: Self-pay | Admitting: Internal Medicine

## 2023-12-28 DIAGNOSIS — R928 Other abnormal and inconclusive findings on diagnostic imaging of breast: Secondary | ICD-10-CM

## 2023-12-29 ENCOUNTER — Ambulatory Visit: Payer: Self-pay | Admitting: Internal Medicine

## 2024-01-05 ENCOUNTER — Ambulatory Visit
Admission: RE | Admit: 2024-01-05 | Discharge: 2024-01-05 | Disposition: A | Discharge status: Home / Self Care | Source: Ambulatory Visit | Attending: Internal Medicine | Admitting: Internal Medicine

## 2024-01-05 DIAGNOSIS — R928 Other abnormal and inconclusive findings on diagnostic imaging of breast: Secondary | ICD-10-CM | POA: Diagnosis present

## 2024-01-06 ENCOUNTER — Ambulatory Visit: Payer: Self-pay | Admitting: Internal Medicine

## 2024-02-24 ENCOUNTER — Ambulatory Visit: Admitting: Internal Medicine

## 2024-02-24 ENCOUNTER — Encounter: Payer: Self-pay | Admitting: Internal Medicine

## 2024-02-24 VITALS — BP 120/84 | HR 78 | Ht 59.0 in | Wt 100.0 lb

## 2024-02-24 DIAGNOSIS — E782 Mixed hyperlipidemia: Secondary | ICD-10-CM | POA: Diagnosis not present

## 2024-02-24 DIAGNOSIS — R000 Tachycardia, unspecified: Secondary | ICD-10-CM

## 2024-02-24 DIAGNOSIS — R030 Elevated blood-pressure reading, without diagnosis of hypertension: Secondary | ICD-10-CM

## 2024-02-24 DIAGNOSIS — Z0130 Encounter for examination of blood pressure without abnormal findings: Secondary | ICD-10-CM

## 2024-02-24 MED ORDER — ROSUVASTATIN CALCIUM 10 MG PO TABS: 10.0000 mg | ORAL_TABLET | Freq: Every day | ORAL | 2 refills | Status: AC

## 2024-02-24 NOTE — Progress Notes (Signed)
 Established Patient Office Visit  Subjective:  Patient ID: Cindy [name], female    DOB: 07-Jan-1951  Age: 73 y.o. MRN: [id number]  Chief Complaint  Patient presents with   Follow-up    3 month follow up    Patient comes in for follow-up today.  She is generally feeling well and has no new she is taking all her medications as per hide.  Is up to date on her mammogram.  Her blood pressure is stable. Her previous thyroid profile showed a low T3 and T4 with normal TSH.  Will repeat labs today.    No other concerns at this time.   Past Medical History:  Diagnosis Date   Mixed hyperlipidemia     Past Surgical History:  Procedure Laterality Date   BTL  1992   EYE SURGERY  2015   cataracts    Social History   Socioeconomic History   Marital status: Married    Spouse name: Not on file   Number of children: Not on file   Years of education: Not on file   Highest education level: Not on file  Occupational History   Not on file  Tobacco Use   Smoking status: Never   Smokeless tobacco: Never  Substance and Sexual Activity   Alcohol use: No   Drug use: No   Sexual activity: Not on file  Other Topics Concern   Not on file  Social History Narrative   Not on file   Social Drivers of Health   Financial Resource Strain: Not on file  Food Insecurity: Not on file  Transportation Needs: Not on file  Physical Activity: Not on file  Stress: Not on file  Social Connections: Not on file  Intimate Partner Violence: Not on file    Family History  Problem Relation Age of Onset   Heart failure Mother     No Known Allergies  Outpatient Medications Prior to Visit  Medication Sig   Acetaminophen (TYLENOL 8 HOUR PO) Take 200 mg by mouth as needed.   alendronate (FOSAMAX) 70 MG tablet Take 1 tablet (70 mg total) by mouth once a week.   CALCIUM-VITAMIN D PO Take by mouth.   Multiple Vitamin (MULTIVITAMIN) capsule Take 1 capsule by mouth daily.   [DISCONTINUED] rosuvastatin  (CRESTOR) 10 MG tablet Take 1 tablet by mouth once daily   No facility-administered medications prior to visit.    Review of Systems  Constitutional: Negative.  Negative for chills, fever and malaise/fatigue.  HENT: Negative.  Negative for sore throat.   Eyes: Negative.   Respiratory: Negative.  Negative for cough and shortness of breath.   Cardiovascular: Negative.  Negative for chest pain, palpitations and leg swelling.  Gastrointestinal: Negative.  Negative for abdominal pain, constipation, diarrhea, heartburn, nausea and vomiting.  Genitourinary: Negative.  Negative for dysuria and flank pain.  Musculoskeletal: Negative.  Negative for joint pain and myalgias.  Skin: Negative.   Neurological: Negative.  Negative for dizziness, tingling, tremors and headaches.  Endo/Heme/Allergies: Negative.   Psychiatric/Behavioral: Negative.  Negative for depression and suicidal ideas. The patient is not nervous/anxious.        Objective:   BP 120/84   Pulse 78   Ht 4' 11 (1.499 m)   Wt 100 lb (45.4 kg)   SpO2 98%   BMI 20.20 kg/m   Vitals:   02/24/24 1255  BP: 120/84  Pulse: 78  Height: 4' 11 (1.499 m)  Weight: 100 lb (45.4 kg)  SpO2:  98%  BMI (Calculated): 20.19    Physical Exam Vitals and nursing note reviewed.  Constitutional:      Appearance: Normal appearance.  HENT:     Head: Normocephalic and atraumatic.     Nose: Nose normal.     Mouth/Throat:     Mouth: Mucous membranes are moist.     Pharynx: Oropharynx is clear.  Eyes:     Conjunctiva/sclera: Conjunctivae normal.     Pupils: Pupils are equal, round, and reactive to light.  Cardiovascular:     Rate and Rhythm: Normal rate and regular rhythm.     Pulses: Normal pulses.     Heart sounds: Normal heart sounds. No murmur heard. Pulmonary:     Effort: Pulmonary effort is normal.     Breath sounds: Normal breath sounds. No wheezing.  Abdominal:     General: Bowel sounds are normal.     Palpations: Abdomen is  soft.     Tenderness: There is no abdominal tenderness. There is no right CVA tenderness or left CVA tenderness.  Musculoskeletal:        General: Normal range of motion.     Cervical back: Normal range of motion.     Right lower leg: No edema.     Left lower leg: No edema.  Skin:    General: Skin is warm and dry.  Neurological:     General: No focal deficit present.     Mental Status: She is alert and oriented to person, place, and time.  Psychiatric:        Mood and Affect: Mood normal.        Behavior: Behavior normal.      No results found for any visits on 02/24/24.  No results found for this or any previous visit (from the past 2160 hours).    Assessment & Plan:  Continue current medications.  Check blood work. Problem List Items Addressed This Visit     Mixed hyperlipidemia   Relevant Medications   rosuvastatin (CRESTOR) 10 MG tablet   Other Relevant Orders   CMP14+EGFR   Lipid Panel w/o Chol/HDL Ratio   Other Visit Diagnoses       Tachycardia       Relevant Orders   CBC with Diff   TSH+T4F+T3Free       Return in about 4 months (around 06/25/2024).   Total time spent: 30 minutes  FERNAND FREDY RAMAN, MD  02/24/2024   This document may have been prepared by [facility] Voice Recognition software and as such may include unintentional dictation errors.

## 2024-02-25 LAB — CBC WITH DIFFERENTIAL/PLATELET
Basophils Absolute: 0.1 x10E3/uL (ref 0.0–0.2)
Basos: 1 %
EOS (ABSOLUTE): 0.4 x10E3/uL (ref 0.0–0.4)
Eos: 4 %
Hematocrit: 38.8 % (ref 34.0–46.6)
Hemoglobin: 12.5 g/dL (ref 11.1–15.9)
Immature Grans (Abs): 0 x10E3/uL (ref 0.0–0.1)
Immature Granulocytes: 0 %
Lymphocytes Absolute: 3.2 x10E3/uL — ABNORMAL HIGH (ref 0.7–3.1)
Lymphs: 31 %
MCH: 32.1 pg (ref 26.6–33.0)
MCHC: 32.2 g/dL (ref 31.5–35.7)
MCV: 100 fL — ABNORMAL HIGH (ref 79–97)
Monocytes Absolute: 0.5 x10E3/uL (ref 0.1–0.9)
Monocytes: 5 %
Neutrophils Absolute: 6.1 x10E3/uL (ref 1.4–7.0)
Neutrophils: 59 %
Platelets: 311 x10E3/uL (ref 150–450)
RBC: 3.9 x10E6/uL (ref 3.77–5.28)
RDW: 12.8 % (ref 11.7–15.4)
WBC: 10.4 x10E3/uL (ref 3.4–10.8)

## 2024-02-25 LAB — CMP14+EGFR
ALT: 54 IU/L — ABNORMAL HIGH (ref 0–32)
AST: 63 IU/L — ABNORMAL HIGH (ref 0–40)
Albumin: 4.7 g/dL (ref 3.9–4.9)
Alkaline Phosphatase: 64 IU/L (ref 44–121)
BUN/Creatinine Ratio: 20 (ref 12–28)
BUN: 19 mg/dL (ref 8–27)
Bilirubin Total: 0.7 mg/dL (ref 0.0–1.2)
CO2: 22 mmol/L (ref 20–29)
Calcium: 9.4 mg/dL (ref 8.7–10.3)
Chloride: 101 mmol/L (ref 96–106)
Creatinine, Ser: 0.93 mg/dL (ref 0.57–1.00)
Globulin, Total: 2.3 g/dL (ref 1.5–4.5)
Glucose: 79 mg/dL (ref 70–99)
Potassium: 4.3 mmol/L (ref 3.5–5.2)
Sodium: 140 mmol/L (ref 134–144)
Total Protein: 7 g/dL (ref 6.0–8.5)
eGFR: 66 mL/min/1.73 (ref >59)

## 2024-02-25 LAB — TSH+T4F+T3FREE
Free T4: 0.25 ng/dL — ABNORMAL LOW (ref 0.82–1.77)
T3, Free: 1.2 pg/mL — ABNORMAL LOW (ref 2.0–4.4)
TSH: 4.72 u[IU]/mL — ABNORMAL HIGH (ref 0.450–4.500)

## 2024-02-25 LAB — LIPID PANEL W/O CHOL/HDL RATIO
Cholesterol, Total: 210 mg/dL — ABNORMAL HIGH (ref 100–199)
HDL: 87 mg/dL (ref >39)
LDL Chol Calc (NIH): 114 mg/dL — ABNORMAL HIGH (ref 0–99)
Triglycerides: 50 mg/dL (ref 0–149)
VLDL Cholesterol Cal: 9 mg/dL (ref 5–40)

## 2024-02-27 ENCOUNTER — Ambulatory Visit: Payer: Self-pay | Admitting: Internal Medicine

## 2024-02-27 DIAGNOSIS — E039 Hypothyroidism, unspecified: Secondary | ICD-10-CM

## 2024-02-27 NOTE — Progress Notes (Signed)
 Patient notified

## 2024-03-08 ENCOUNTER — Ambulatory Visit
Admission: RE | Admit: 2024-03-08 | Discharge: 2024-03-08 | Disposition: A | Discharge status: Home / Self Care | Source: Ambulatory Visit | Attending: Internal Medicine | Admitting: Internal Medicine

## 2024-03-08 DIAGNOSIS — E039 Hypothyroidism, unspecified: Secondary | ICD-10-CM | POA: Diagnosis present

## 2024-03-13 ENCOUNTER — Ambulatory Visit: Payer: Self-pay | Admitting: Internal Medicine

## 2024-03-13 DIAGNOSIS — E039 Hypothyroidism, unspecified: Secondary | ICD-10-CM

## 2024-03-13 MED ORDER — LEVOTHYROXINE SODIUM 25 MCG PO TABS: 25.0000 ug | ORAL_TABLET | Freq: Every day | ORAL | 6 refills | Status: AC

## 2024-03-15 NOTE — Telephone Encounter (Signed)
 Patient called asking about her results, looks like Cindy [name] sent them to you can you call her with them.

## 2024-03-19 ENCOUNTER — Telehealth: Payer: Self-pay | Admitting: Internal Medicine

## 2024-03-19 NOTE — Telephone Encounter (Signed)
 Pt called again asking for her US  results, states she has left voicemails and hasn't received a call back

## 2024-03-19 NOTE — Progress Notes (Signed)
 Patient notified

## 2024-04-20 ENCOUNTER — Other Ambulatory Visit

## 2024-04-20 ENCOUNTER — Other Ambulatory Visit: Payer: Self-pay | Admitting: Internal Medicine

## 2024-04-20 DIAGNOSIS — E039 Hypothyroidism, unspecified: Secondary | ICD-10-CM

## 2024-04-21 LAB — TSH+T4F+T3FREE
Free T4: 0.71 ng/dL — ABNORMAL LOW (ref 0.82–1.77)
T3, Free: 1.7 pg/mL — ABNORMAL LOW (ref 2.0–4.4)
TSH: 4.24 u[IU]/mL (ref 0.450–4.500)

## 2024-04-23 ENCOUNTER — Ambulatory Visit: Payer: Self-pay | Admitting: Internal Medicine

## 2024-04-25 NOTE — Progress Notes (Signed)
 Patient notified

## 2024-06-29 ENCOUNTER — Ambulatory Visit: Admitting: Internal Medicine

## 2024-06-29 ENCOUNTER — Encounter: Payer: Self-pay | Admitting: Internal Medicine

## 2024-06-29 VITALS — BP 120/82 | HR 95 | Ht 59.0 in | Wt 98.4 lb

## 2024-06-29 DIAGNOSIS — R000 Tachycardia, unspecified: Secondary | ICD-10-CM

## 2024-06-29 DIAGNOSIS — Z124 Encounter for screening for malignant neoplasm of cervix: Secondary | ICD-10-CM | POA: Diagnosis not present

## 2024-06-29 DIAGNOSIS — R87610 Atypical squamous cells of undetermined significance on cytologic smear of cervix (ASC-US): Secondary | ICD-10-CM

## 2024-06-29 DIAGNOSIS — Z1272 Encounter for screening for malignant neoplasm of vagina: Secondary | ICD-10-CM | POA: Diagnosis not present

## 2024-06-29 DIAGNOSIS — E782 Mixed hyperlipidemia: Secondary | ICD-10-CM | POA: Diagnosis not present

## 2024-06-29 DIAGNOSIS — Z0130 Encounter for examination of blood pressure without abnormal findings: Secondary | ICD-10-CM

## 2024-06-29 DIAGNOSIS — Z1151 Encounter for screening for human papillomavirus (HPV): Secondary | ICD-10-CM | POA: Diagnosis not present

## 2024-06-29 NOTE — Progress Notes (Signed)
 "  Established Patient Office Visit  Subjective:  Patient ID: Cindy [name], female    DOB: 1951-01-30  Age: 73 y.o. MRN: [id number]  Chief Complaint  Patient presents with   Follow-up    4 month follow up    Patient comes in for her follow-up today.  She is generally feeling well and has no new complaints.  She takes her medications regularly.  She is due for her labs today.  Her mammogram is up-to-date. She needs a Pap today as the one last year showed ASCUS.  Her HPV was negative.    No other concerns at this time.   Past Medical History:  Diagnosis Date   Mixed hyperlipidemia     Past Surgical History:  Procedure Laterality Date   BTL  1992   EYE SURGERY  2015   cataracts    Social History   Socioeconomic History   Marital status: Married    Spouse name: Not on file   Number of children: Not on file   Years of education: Not on file   Highest education level: Not on file  Occupational History   Not on file  Tobacco Use   Smoking status: Never   Smokeless tobacco: Never  Substance and Sexual Activity   Alcohol use: No   Drug use: No   Sexual activity: Not on file  Other Topics Concern   Not on file  Social History Narrative   Not on file   Social Drivers of Health   Tobacco Use: Low Risk (06/29/2024)   Patient History    Smoking Tobacco Use: Never    Smokeless Tobacco Use: Never    Passive Exposure: Not on file  Financial Resource Strain: Not on file  Food Insecurity: Not on file  Transportation Needs: Not on file  Physical Activity: Not on file  Stress: Not on file  Social Connections: Not on file  Intimate Partner Violence: Not on file  Depression (PHQ2-9): Low Risk (06/30/2023)   Depression (PHQ2-9)    PHQ-2 Score: 0  Alcohol Screen: Not on file  Housing: Not on file  Utilities: Not on file  Health Literacy: Not on file    Family History  Problem Relation Age of Onset   Heart failure Mother     Allergies[1]  Show/hide medication  list[2]  Review of Systems  Constitutional: Negative.  Negative for chills, fever and malaise/fatigue.  HENT: Negative.  Negative for congestion and sore throat.   Eyes: Negative.  Negative for blurred vision and pain.  Respiratory: Negative.  Negative for cough and shortness of breath.   Cardiovascular: Negative.  Negative for chest pain, palpitations and leg swelling.  Gastrointestinal: Negative.  Negative for abdominal pain, blood in stool, constipation, diarrhea, heartburn, melena, nausea and vomiting.  Genitourinary: Negative.  Negative for dysuria, flank pain, frequency and urgency.  Musculoskeletal: Negative.  Negative for joint pain and myalgias.  Skin: Negative.   Neurological: Negative.  Negative for dizziness, tingling, sensory change, weakness and headaches.  Endo/Heme/Allergies: Negative.   Psychiatric/Behavioral: Negative.  Negative for depression and suicidal ideas. The patient is not nervous/anxious.        Objective:   BP 120/82   Pulse 95   Ht 4' 11 (1.499 m)   Wt 98 lb 6.4 oz (44.6 kg)   SpO2 97%   BMI 19.87 kg/m   Vitals:   06/29/24 1120  BP: 120/82  Pulse: 95  Height: 4' 11 (1.499 m)  Weight: 98 lb 6.4 oz (  44.6 kg)  SpO2: 97%  BMI (Calculated): 19.86    Physical Exam Vitals and nursing note reviewed. Exam conducted with a chaperone present.  Constitutional:      Appearance: Normal appearance.  HENT:     Head: Normocephalic and atraumatic.     Nose: Nose normal.     Mouth/Throat:     Mouth: Mucous membranes are moist.     Pharynx: Oropharynx is clear.  Eyes:     Conjunctiva/sclera: Conjunctivae normal.     Pupils: Pupils are equal, round, and reactive to light.  Cardiovascular:     Rate and Rhythm: Normal rate and regular rhythm.     Pulses: Normal pulses.     Heart sounds: Normal heart sounds. No murmur heard. Pulmonary:     Effort: Pulmonary effort is normal.     Breath sounds: Normal breath sounds. No wheezing.  Chest:  Breasts:     Right: Normal. No swelling, bleeding, inverted nipple, mass, nipple discharge, skin change or tenderness.     Left: Normal. No swelling, bleeding, inverted nipple, mass, nipple discharge, skin change or tenderness.  Abdominal:     General: Bowel sounds are normal.     Palpations: Abdomen is soft.     Tenderness: There is no abdominal tenderness. There is no right CVA tenderness or left CVA tenderness.     Hernia: There is no hernia in the left inguinal area or right inguinal area.  Genitourinary:    General: Normal vulva.     Pubic Area: No rash or pubic lice.      Labia:        Right: No rash, tenderness, lesion or injury.        Left: No rash, tenderness, lesion or injury.      Urethra: No prolapse.  Musculoskeletal:        General: Normal range of motion.     Cervical back: Normal range of motion.     Right lower leg: No edema.     Left lower leg: No edema.  Lymphadenopathy:     Upper Body:     Right upper body: No supraclavicular, axillary or pectoral adenopathy.     Left upper body: No supraclavicular, axillary or pectoral adenopathy.     Lower Body: No right inguinal adenopathy. No left inguinal adenopathy.  Skin:    General: Skin is warm and dry.  Neurological:     General: No focal deficit present.     Mental Status: She is alert and oriented to person, place, and time.  Psychiatric:        Mood and Affect: Mood normal.        Behavior: Behavior normal.      No results found for any visits on 06/29/24.  Recent Results (from the past 2160 hours)  TSH+T4F+T3Free     Status: Abnormal   Collection Time: 04/20/24  9:29 AM  Result Value Ref Range   TSH 4.240 0.450 - 4.500 uIU/mL   T3, Free 1.7 (L) 2.0 - 4.4 pg/mL   Free T4 0.71 (L) 0.82 - 1.77 ng/dL      Assessment & Plan:  Continue all medications.  Check labs today.  Check Pap. Problem List Items Addressed This Visit       Other   Mixed hyperlipidemia - Primary   Relevant Orders   CMP14+EGFR   Lipid Panel  w/o Chol/HDL Ratio   Other Visit Diagnoses       Vaginal Pap smear  Relevant Orders   IGP, Aptima HPV     Screening for human papillomavirus (HPV)       Relevant Orders   IGP, Aptima HPV     Screening for cervical cancer       Relevant Orders   IGP, Aptima HPV     Tachycardia       Relevant Orders   CBC with Diff   TSH+T4F+T3Free     ASCUS of cervix with negative high risk HPV           Return in about 3 months (around 09/27/2024).   Total time spent: 30 minutes. This time includes review of previous notes and results and patient face to face interaction during today's visit.    FERNAND FREDY RAMAN, MD  06/29/2024   This document may have been prepared by [facility] Voice Recognition software and as such may include unintentional dictation errors.     [1] No Known Allergies [2]  Outpatient Medications Prior to Visit  Medication Sig   Acetaminophen (TYLENOL 8 HOUR PO) Take 200 mg by mouth as needed.   alendronate  (FOSAMAX ) 70 MG tablet Take 1 tablet (70 mg total) by mouth once a week.   CALCIUM -VITAMIN D  PO Take by mouth.   levothyroxine  (SYNTHROID ) 25 MCG tablet Take 1 tablet (25 mcg total) by mouth daily before breakfast.   Multiple Vitamin (MULTIVITAMIN) capsule Take 1 capsule by mouth daily.   rosuvastatin  (CRESTOR ) 10 MG tablet Take 1 tablet (10 mg total) by mouth daily.   No facility-administered medications prior to visit.   "

## 2024-06-30 LAB — CBC WITH DIFFERENTIAL/PLATELET
Basophils Absolute: 0.1 x10E3/uL (ref 0.0–0.2)
Basos: 1 %
EOS (ABSOLUTE): 0.4 x10E3/uL (ref 0.0–0.4)
Eos: 4 %
Hematocrit: 37.6 % (ref 34.0–46.6)
Hemoglobin: 12 g/dL (ref 11.1–15.9)
Immature Grans (Abs): 0 x10E3/uL (ref 0.0–0.1)
Immature Granulocytes: 0 %
Lymphocytes Absolute: 2.8 x10E3/uL (ref 0.7–3.1)
Lymphs: 32 %
MCH: 31.3 pg (ref 26.6–33.0)
MCHC: 31.9 g/dL (ref 31.5–35.7)
MCV: 98 fL — ABNORMAL HIGH (ref 79–97)
Monocytes Absolute: 0.5 x10E3/uL (ref 0.1–0.9)
Monocytes: 6 %
Neutrophils Absolute: 4.9 x10E3/uL (ref 1.4–7.0)
Neutrophils: 57 %
Platelets: 297 x10E3/uL (ref 150–450)
RBC: 3.84 x10E6/uL (ref 3.77–5.28)
RDW: 11.8 % (ref 11.7–15.4)
WBC: 8.7 x10E3/uL (ref 3.4–10.8)

## 2024-06-30 LAB — CMP14+EGFR
ALT: 16 IU/L (ref 0–32)
AST: 25 IU/L (ref 0–40)
Albumin: 4.3 g/dL (ref 3.8–4.8)
Alkaline Phosphatase: 58 IU/L (ref 49–135)
BUN/Creatinine Ratio: 21 (ref 12–28)
BUN: 18 mg/dL (ref 8–27)
Bilirubin Total: 0.5 mg/dL (ref 0.0–1.2)
CO2: 21 mmol/L (ref 20–29)
Calcium: 9.4 mg/dL (ref 8.7–10.3)
Chloride: 107 mmol/L — ABNORMAL HIGH (ref 96–106)
Creatinine, Ser: 0.85 mg/dL (ref 0.57–1.00)
Globulin, Total: 2.2 g/dL (ref 1.5–4.5)
Glucose: 94 mg/dL (ref 70–99)
Potassium: 4.2 mmol/L (ref 3.5–5.2)
Sodium: 144 mmol/L (ref 134–144)
Total Protein: 6.5 g/dL (ref 6.0–8.5)
eGFR: 73 mL/min/1.73

## 2024-06-30 LAB — TSH+T4F+T3FREE
Free T4: 0.84 ng/dL (ref 0.82–1.77)
T3, Free: 2 pg/mL (ref 2.0–4.4)
TSH: 2.72 u[IU]/mL (ref 0.450–4.500)

## 2024-06-30 LAB — LIPID PANEL W/O CHOL/HDL RATIO
Cholesterol, Total: 185 mg/dL (ref 100–199)
HDL: 70 mg/dL
LDL Chol Calc (NIH): 104 mg/dL — ABNORMAL HIGH (ref 0–99)
Triglycerides: 60 mg/dL (ref 0–149)
VLDL Cholesterol Cal: 11 mg/dL (ref 5–40)

## 2024-07-02 ENCOUNTER — Ambulatory Visit: Payer: Self-pay | Admitting: Internal Medicine

## 2024-07-02 NOTE — Progress Notes (Signed)
 Patient notified

## 2024-07-10 LAB — IGP, APTIMA HPV
HPV Aptima: NEGATIVE
PAP Smear Comment: 0

## 2024-09-27 ENCOUNTER — Ambulatory Visit: Admitting: Internal Medicine
